# Patient Record
Sex: Female | Born: 1980 | Race: White | Hispanic: No | Marital: Single | Smoking: Never smoker
Health system: Southern US, Community
[De-identification: ages and names within clinical notes are randomized; demographics above are authoritative.]

## PROBLEM LIST (undated history)

## (undated) DIAGNOSIS — J45909 Unspecified asthma, uncomplicated: Secondary | ICD-10-CM

## (undated) DIAGNOSIS — T7840XA Allergy, unspecified, initial encounter: Secondary | ICD-10-CM

## (undated) DIAGNOSIS — F329 Major depressive disorder, single episode, unspecified: Secondary | ICD-10-CM

## (undated) DIAGNOSIS — F32A Depression, unspecified: Secondary | ICD-10-CM

## (undated) HISTORY — DX: Allergy, unspecified, initial encounter: T78.40XA

## (undated) HISTORY — DX: Major depressive disorder, single episode, unspecified: F32.9

## (undated) HISTORY — DX: Depression, unspecified: F32.A

---

## 2007-08-02 ENCOUNTER — Emergency Department (HOSPITAL_COMMUNITY): Admission: EM | Admit: 2007-08-02 | Discharge: 2007-08-02 | Payer: Self-pay | Admitting: Family Medicine

## 2007-12-12 ENCOUNTER — Emergency Department (HOSPITAL_COMMUNITY): Admission: EM | Admit: 2007-12-12 | Discharge: 2007-12-12 | Payer: Self-pay | Admitting: Family Medicine

## 2009-04-01 ENCOUNTER — Ambulatory Visit: Payer: Self-pay | Admitting: Occupational Medicine

## 2009-04-01 DIAGNOSIS — J309 Allergic rhinitis, unspecified: Secondary | ICD-10-CM | POA: Insufficient documentation

## 2009-04-01 DIAGNOSIS — J45909 Unspecified asthma, uncomplicated: Secondary | ICD-10-CM | POA: Insufficient documentation

## 2009-12-02 ENCOUNTER — Ambulatory Visit: Payer: Self-pay | Admitting: Family Medicine

## 2009-12-23 ENCOUNTER — Other Ambulatory Visit: Admission: RE | Admit: 2009-12-23 | Discharge: 2009-12-23 | Payer: Self-pay | Admitting: Family Medicine

## 2009-12-23 ENCOUNTER — Ambulatory Visit: Payer: Self-pay | Admitting: Family Medicine

## 2009-12-23 DIAGNOSIS — N912 Amenorrhea, unspecified: Secondary | ICD-10-CM | POA: Insufficient documentation

## 2009-12-25 LAB — CONVERTED CEMR LAB
AST: 14 units/L (ref 0–37)
Albumin: 4.8 g/dL (ref 3.5–5.2)
Alkaline Phosphatase: 72 units/L (ref 39–117)
Calcium: 9.2 mg/dL (ref 8.4–10.5)
Chloride: 106 meq/L (ref 96–112)
Glucose, Bld: 84 mg/dL (ref 70–99)
LDL Cholesterol: 103 mg/dL — ABNORMAL HIGH (ref 0–99)
LH: 14.7 milliintl units/mL
Potassium: 4.5 meq/L (ref 3.5–5.3)
Sodium: 141 meq/L (ref 135–145)
TSH: 1.964 microintl units/mL (ref 0.350–4.500)
Total Protein: 7 g/dL (ref 6.0–8.3)
Triglycerides: 69 mg/dL (ref <150)

## 2009-12-27 LAB — CONVERTED CEMR LAB: Pap Smear: NEGATIVE

## 2010-01-05 ENCOUNTER — Ambulatory Visit: Payer: Self-pay | Admitting: Emergency Medicine
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
## 2010-03-18 NOTE — Assessment & Plan Note (Signed)
Summary: COUGH,FEVER,CONGESTION/TJ   Vital Signs:  Patient Profile:   30 Years Old Female CC:      Congestion, Productive cough, eyes matted shut this am, x 4 days Height:     63 inches Weight:      240 pounds O2 Sat:      98 % O2 treatment:    Room Air Temp:     99.2 degrees F oral Pulse rate:   89 / minute Pulse rhythm:   regular Resp:     20 per minute BP sitting:   135 / 88  (right arm) Cuff size:   large  Pt. in pain?   yes  Vitals Entered By: Emilio Math, CMA                   Current Allergies: ! * SEAFOOD ! * POLLEN ! * DUST ! * MOLDHistory of Present Illness Chief Complaint: Congestion, Productive cough, eyes matted shut this am, x 4 days History of Present Illness: Presents with a 4 day history of sinus congestion and drainage.   Productive cough.   Says she has felt cold and hot.   Taking tylenol.   Complains of generalized body aches and generally feeling miserable.  History of asthma.   Some wheezing lately.  Used her nebulizer today.    Current Meds MUCINEX DM 30-600 MG XR12H-TAB (DEXTROMETHORPHAN-GUAIFENESIN)  TYLENOL 325 MG TABS (ACETAMINOPHEN) 3 at a time ALBUTEROL SULFATE (2.5 MG/3ML) 0.083% NEBU (ALBUTEROL SULFATE)  PREDNISONE 10 MG TABS (PREDNISONE) 2 PO BID for 2 days, then 1 BID for 2 days, then 1 daily for 2 days.  Take PC CHERATUSSIN DAC 30-10-100 MG/5ML SOLN (PSEUDOEPHEDRINE-CODEINE-GG) 5 ml every 8 hours for cough  REVIEW OF SYSTEMS Constitutional Symptoms       Complains of fever.     Denies chills, night sweats, weight loss, weight gain, and fatigue.  Eyes       Complains of eye pain and eye drainage.      Denies change in vision, glasses, contact lenses, and eye surgery. Ear/Nose/Throat/Mouth       Complains of ear pain, frequent runny nose, sinus problems, and sore throat.      Denies hearing loss/aids, change in hearing, ear discharge, dizziness, frequent nose bleeds, hoarseness, and tooth pain or bleeding.  Respiratory  Complains of productive cough, wheezing, shortness of breath, and asthma.      Denies dry cough, bronchitis, and emphysema/COPD.  Cardiovascular       Complains of chest pain.      Denies murmurs and tires easily with exhertion.    Gastrointestinal       Denies stomach pain, nausea/vomiting, diarrhea, constipation, blood in bowel movements, and indigestion. Genitourniary       Denies painful urination, kidney stones, and loss of urinary control. Neurological       Complains of headaches.      Denies paralysis, seizures, and fainting/blackouts. Musculoskeletal       Denies muscle pain, joint pain, joint stiffness, decreased range of motion, redness, swelling, muscle weakness, and gout.  Skin       Denies bruising, unusual mles/lumps or sores, and hair/skin or nail changes.  Psych       Denies mood changes, temper/anger issues, anxiety/stress, speech problems, depression, and sleep problems.  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  Take PC  #14 x 0, 04/01/2009, Kathrine Haddock MD  New Orders: New Patient Level III [id number] Planning Comments:   supportive care prednisone taper Hot steamy showers to help sinus drainage Cheratussin PRN   The patient and/or caregiver has been counseled thoroughly with regard to medications prescribed including dosage, schedule, interactions, rationale for use, and  possible side effects and they verbalize understanding.  Diagnoses and expected course of recovery discussed and will return if not improved as expected or if the condition worsens. Patient and/or caregiver verbalized understanding.  Prescriptions: CHERATUSSIN DAC 30-10-100 MG/5ML SOLN (PSEUDOEPHEDRINE-CODEINE-GG) 5 ml every 8 hours for cough  #4 oz x 0   Entered and Authorized by:   Kathrine Haddock MD   Signed by:   Kathrine Haddock MD on 04/01/2009   Method used:   Print then Give to Patient   RxID:   [id number] PREDNISONE 10 MG TABS (PREDNISONE) 2 PO BID for 2 days, then 1 BID for 2 days, then 1 daily for 2 days.  Take PC  #14 x 0   Entered and Authorized by:   Kathrine Haddock MD   Signed by:   Kathrine Haddock MD on 04/01/2009   Method used:   Print then Give to Patient   RxID:   [id number]

## 2010-03-18 NOTE — Assessment & Plan Note (Signed)
Summary: COLD/TM   Vital Signs:  Patient Profile:   30 Years Old Female CC:      Runny nose, productive cough, fever, chills, sore throat x  6 days Height:     63 inches Weight:      203 pounds O2 Sat:      97 % O2 treatment:    Room Air Temp:     99.0 degrees F oral Pulse rate:   69 / minute Pulse rhythm:   regular Resp:     14 per minute BP sitting:   120 / 84  (left arm) Cuff size:   regular  Vitals Entered By: Emilio Math (January 05, 2010 12:42 PM)                  Current Allergies (reviewed today): ! * SEAFOOD ! * POLLEN ! * DUST ! * MOLDHistory of Present Illness Chief Complaint: Runny nose, productive cough, fever, chills, sore throat x  6 days History of Present Illness: Patient complains of onset of cold symptoms for 6 days.  They have been using a lot of different OTC cough & cold meds which is helping a little bit.  She has a history of both allergies and asthma. + sore throat + cough No pleuritic pain + wheezing No nasal congestion + post-nasal drainage No sinus pain/pressure No itchy/red eyes No earache No hemoptysis No SOB No chills/sweats No fever No nausea No vomiting No abdominal pain No diarrhea No skin rashes + fatigue No myalgias + headache   Current Meds PROVERA 10 MG TABS (MEDROXYPROGESTERONE ACETATE) 1 tab by mouth at bedtime x 10 days TYLENOL COLD MULTI-SYMPTOM 10-5-325 MG/15ML LIQD (DM-PHENYLEPHRINE-ACETAMINOPHEN)  MUCINEX DM 30-600 MG XR12H-TAB (DEXTROMETHORPHAN-GUAIFENESIN)  CLARITIN 10 MG TABS (LORATADINE)  PREDNISONE 10 MG TABS (PREDNISONE) 20mg  two times a day for 5 days AMOXICILLIN 875 MG TABS (AMOXICILLIN) 1 tab by mouth two times a day for 7 days  REVIEW OF SYSTEMS Constitutional Symptoms       Complains of fever and chills.     Denies night sweats, weight loss, weight gain, and fatigue.  Eyes       Denies change in vision, eye pain, eye discharge, glasses, contact lenses, and eye surgery. Ear/Nose/Throat/Mouth       Complains of ear pain, frequent runny nose, sinus problems, sore throat, and hoarseness.      Denies hearing loss/aids, change in hearing, ear discharge, dizziness, frequent nose bleeds, and tooth pain or bleeding.  Respiratory       Complains of productive cough, wheezing, shortness of breath, and asthma.      Denies dry cough, bronchitis, and emphysema/COPD.  Cardiovascular       Complains of chest pain.      Denies murmurs and tires easily with exhertion.    Gastrointestinal       Complains of stomach pain, nausea/vomiting, and diarrhea.      Denies constipation, blood in bowel movements, and indigestion. Genitourniary       Denies painful urination, kidney stones, and loss of urinary control. Neurological       Complains of headaches.      Denies paralysis, seizures, and fainting/blackouts. Musculoskeletal       Denies muscle pain, joint pain, joint stiffness, decreased range of motion, redness, swelling, muscle weakness, and gout.  Skin       Denies bruising, unusual mles/lumps or sores, and hair/skin or nail changes.  Psych       Denies mood changes,  temper/anger issues, anxiety/stress, speech problems, depression, and sleep problems.  Past History:  Past Medical History: Reviewed history from 12/02/2009 and no changes required. Allergic rhinitis Asthma teenage depression  Past Surgical History: Reviewed history from 04/01/2009 and no changes required. Denies surgical history  Family History: Reviewed history from 12/02/2009 and no changes required. Mother, (CML) Leukemia, HTN,Diabetes,High Cholesterol, DM Father, UKN, ETOH sister asthma, HTN fam hx of depression.  Social History: Reviewed history from 12/02/2009 and no changes required. 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 Rare ETOH. Exercises 3-4 x a wk. Wt down from 260 in April. In a lesbian relationship. Physical Exam General appearance: well developed, well nourished, hoarse  voice Pupils: equal, round, reactive to light Ears: mild clear fluid behind L TM, o/w normal Nasal: clear discharge Oral/Pharynx: clear post nasal drip, tonsillar enlargement and mild erythema bilateral Neck: neck supple,  trachea midline, no masses Chest/Lungs: no rales, wheezes, or rhonchi bilateral, breath sounds equal without effort Heart: regular rate and  rhythm, no murmur Skin: no obvious rashes or lesions MSE: oriented to time, place, and person Assessment New Problems: ASTHMA (ICD-493.90) UPPER RESPIRATORY INFECTION, ACUTE (ICD-465.9)   Patient Education: Patient and/or caregiver instructed in the following: rest, fluids.  Plan New Medications/Changes: AMOXICILLIN 875 MG TABS (AMOXICILLIN) 1 tab by mouth two times a day for 7 days  #14 x 0, 01/05/2010, Hoyt Koch MD PREDNISONE 10 MG TABS (PREDNISONE) 20mg  two times a day for 5 days  #QS x 0, 01/05/2010, Hoyt Koch MD  New Orders: Est. Patient Level IV [09811] Pulse Oximetry (single measurment) [94760] Nebulizer Tx [94640] Albuterol Sulfate Sol 1mg  unit dose [B1478] Ipratropium inhalation sol. unit dose [G9562] Planning Comments:   1)  Take the prescribed antibiotic as instructed. (hold for a few days.  If after using prednisone for a few days + inhaler + OTC cold meds, you aren't getting better, you may take the antibiotic) 2)  Use nasal saline solution (over the counter) at least 3 times a day. 3)  Use over the counter decongestants like Zyrtec-D every 12 hours as needed to help with congestion. 4)  Can take tylenol every 6 hours or motrin every 8 hours for pain or fever. 5)  Follow up with your primary doctor  if no improvement in 5-7 days, sooner if increasing pain, fever, or new symptoms.     The patient and/or caregiver has been counseled thoroughly with regard to medications prescribed including dosage, schedule, interactions, rationale for use, and possible side effects and they verbalize  understanding.  Diagnoses and expected course of recovery discussed and will return if not improved as expected or if the condition worsens. Patient and/or caregiver verbalized understanding.  Prescriptions: AMOXICILLIN 875 MG TABS (AMOXICILLIN) 1 tab by mouth two times a day for 7 days  #14 x 0   Entered and Authorized by:   Hoyt Koch MD   Signed by:   Hoyt Koch MD on 01/05/2010   Method used:   Print then Give to Patient   RxID:   [id number] PREDNISONE 10 MG TABS (PREDNISONE) 20mg  two times a day for 5 days  #QS x 0   Entered and Authorized by:   Hoyt Koch MD   Signed by:   Hoyt Koch MD on 01/05/2010   Method used:   Print then Give to Patient   RxID:   [id number]   Medication Administration  Medication # 1:    Medication: Albuterol Sulfate Sol 1mg  unit dose  Diagnosis: ASTHMA (ICD-493.90)    Dose: 3.0 mgs    Route: inhaled    Exp Date: 04/17/2010    Lot #: [id number]    Mfr: Mylan    Given by: Emilio Math (January 05, 2010 1:02 PM)  Medication # 2:    Medication: Ipratropium inhalation sol. unit dose    Diagnosis: ASTHMA (ICD-493.90)    Dose: 0.5 mgs    Route: inhaled    Exp Date: 04/17/2010    Lot #: [id number]    Mfr: Mylan    Patient tolerated medication without complications    Given by: Emilio Math (January 05, 2010 1:03 PM)  Orders Added: 1)  Est. Patient Level IV [04540] 2)  Pulse Oximetry (single measurment) [94760] 3)  Nebulizer Tx [94640] 4)  Albuterol Sulfate Sol 1mg  unit dose [J7613] 5)  Ipratropium inhalation sol. unit dose [J8119]     Medication Administration  Medication # 1:    Medication: Albuterol Sulfate Sol 1mg  unit dose    Diagnosis: ASTHMA (ICD-493.90)    Dose: 3.0 mgs    Route: inhaled    Exp Date: 04/17/2010    Lot #: [id number]    Mfr: Mylan    Given by: Emilio Math (January 05, 2010 1:02 PM)  Medication # 2:    Medication: Ipratropium inhalation sol. unit dose    Diagnosis: ASTHMA  (ICD-493.90)    Dose: 0.5 mgs    Route: inhaled    Exp Date: 04/17/2010    Lot #: [id number]    Mfr: Mylan    Patient tolerated medication without complications    Given by: Emilio Math (January 05, 2010 1:03 PM)  Orders Added: 1)  Est. Patient Level IV [14782] 2)  Pulse Oximetry (single measurment) [94760] 3)  Nebulizer Tx [94640] 4)  Albuterol Sulfate Sol 1mg  unit dose [J7613] 5)  Ipratropium inhalation sol. unit dose [N5621]

## 2010-03-18 NOTE — Miscellaneous (Signed)
Summary: Record/[employer]  Record/[employer]   Imported By: Sherian Rein 12/17/2009 11:07:53  _____________________________________________________________________  External Attachment:    Type:   Image     Comment:   External Document

## 2010-03-18 NOTE — Assessment & Plan Note (Signed)
Summary: CPE with pap   Vital Signs:  Patient profile:   30 year old female Menstrual status:  irregular LMP:     11/2008 Height:      63 inches Weight:      208 pounds BMI:     36.98 O2 Sat:      98 % on Room air Pulse rate:   63 / minute BP sitting:   123 / 79  (left arm) Cuff size:   regular  Vitals Entered By: Payton Spark CMA (December 23, 2009 9:19 AM)  O2 Flow:  Room air CC: CPE w/ pap LMP (date): 11/2008     Menstrual Status irregular Enter LMP: 11/2008   Primary Care Provider:  Seymour Bars DO  CC:  CPE w/ pap.  History of Present Illness: 30 yo nulligravid WF presents for CPE with pap smear.    She is in a same sex relationship and has had a full year w/o a period.  She has slowly been losing wt over the past 6 months, down from 260 max with changes to diet and exercise.  She is not on any contraceptives.  Denies discharge.  Due for fasting labs.  Tdap done 07.  Flu shot is due.  Considering future fertility once she gets her weight down more.  Denies fam hx of breast or colon cancer or of premature heart dz.  Current Medications (verified): 1)  None  Allergies (verified): 1)  ! * Seafood 2)  ! * Pollen 3)  ! * Dust 4)  ! * Mold  Past History:  Past Medical History: Reviewed history from 12/02/2009 and no changes required. Allergic rhinitis Asthma teenage depression  Past Surgical History: Reviewed history from 04/01/2009 and no changes required. Denies surgical history  Family History: Reviewed history from 12/02/2009 and no changes required. Mother, (CML) Leukemia, HTN,Diabetes,High Cholesterol, DM Father, UKN, ETOH sister asthma, HTN fam hx of depression.  Social History: Reviewed history from 12/02/2009 and no changes required. 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 Rare ETOH. Exercises 3-4 x a wk. Wt down from 260 in April. In a lesbian relationship.  Review of Systems  The patient denies anorexia, fever,  weight loss, weight gain, vision loss, decreased hearing, hoarseness, chest pain, syncope, dyspnea on exertion, peripheral edema, prolonged cough, headaches, hemoptysis, abdominal pain, melena, hematochezia, severe indigestion/heartburn, hematuria, incontinence, genital sores, muscle weakness, suspicious skin lesions, transient blindness, difficulty walking, depression, unusual weight change, abnormal bleeding, enlarged lymph nodes, angioedema, breast masses, and testicular masses.    Physical Exam  General:  alert, well-developed, well-nourished, and well-hydrated.   Head:  normocephalic and atraumatic.   Eyes:  wears glasses Ears:  scarring over both TMs Nose:  no nasal discharge.   Mouth:  good dentition and pharynx pink and moist.   Neck:  no masses.   Breasts:  No mass, nodules, thickening, tenderness, bulging, retraction, inflamation, nipple discharge or skin changes noted.   Lungs:  Normal respiratory effort, chest expands symmetrically. Lungs are clear to auscultation, no crackles or wheezes. Heart:  Normal rate and regular rhythm. S1 and S2 normal without gallop, murmur, click, rub or other extra sounds. Abdomen:  Bowel sounds positive,abdomen soft and non-tender without masses, organomegaly Genitalia:  Pelvic Exam:        External: normal female genitalia without lesions or masses        Vagina: normal without lesions or masses        Cervix: normal without lesions or  masses; ectropion present        Adnexa: normal bimanual exam without masses or fullness        Uterus: normal by palpation        Pap smear: performed Pulses:  2+ radial and pedal pulses Extremities:  pes planus, no LE edema Skin:  color normal, no rashes, and no suspicious lesions.   Cervical Nodes:  No lymphadenopathy noted Psych:  good eye contact, not anxious appearing, and not depressed appearing.     Impression & Recommendations:  Problem # 1:  ROUTINE GYNECOLOGICAL EXAMINATION (ICD-V72.31) Thin prep pap  done. Keeping healthy checklist for women reviewed. BP at goal. BMI 36 = class II obesity.  Working on wt loss thru diet/ exercise, wt down from 260. Update fasting labs. MVI daily. Declined contraception (in lesbian relationship). Progesterone withdrawl to restart cycle.    Complete Medication List: 1)  Provera 10 Mg Tabs (Medroxyprogesterone acetate) .Marland Kitchen.. 1 tab by mouth at bedtime x 10 days  Other Orders: T-LH ([id number]) T-FSH [id number]) T-Comprehensive Metabolic Panel [id number]) T-Lipid Profile [id number]) T-TSH [id number])  Patient Instructions: 1)  Labs downstairs today. 2)  Will call you w/ results tomorrow. 3)  Trial of Progesterone withdrawl bleed -- take 1 tab at night x 10 days then stop.  Expect period to start within a week after stopping Progesterone. 4)  Call if any problems. 5)  REturn for f/u as needed. Prescriptions: PROVERA 10 MG TABS (MEDROXYPROGESTERONE ACETATE) 1 tab by mouth at bedtime x 10 days  #10 tabs x 0   Entered and Authorized by:   Seymour Bars DO   Signed by:   Seymour Bars DO on 12/23/2009   Method used:   Electronically to        [facility] [phone]* (retail)       [address] [place], [place]  [address]       Ph: [phone]       Fax: [phone]   RxID:   [id number]    Orders Added: 1)  T-LH [[id number]] 2)  T-FSH [[id number]] 3)  T-Comprehensive Metabolic Panel [[id number]] 4)  T-Lipid Profile [[id number]] 5)  T-TSH [[id number]] 6)  Est. Patient age 28-28 [phone]

## 2010-06-02 ENCOUNTER — Encounter: Payer: Self-pay | Admitting: Family Medicine

## 2010-06-02 ENCOUNTER — Inpatient Hospital Stay (INDEPENDENT_AMBULATORY_CARE_PROVIDER_SITE_OTHER)
Admission: RE | Admit: 2010-06-02 | Discharge: 2010-06-02 | Disposition: A | Discharge status: Home / Self Care | Payer: BC Managed Care – HMO | Source: Ambulatory Visit | Attending: Family Medicine | Admitting: Family Medicine

## 2010-06-02 DIAGNOSIS — J309 Allergic rhinitis, unspecified: Secondary | ICD-10-CM

## 2010-06-02 DIAGNOSIS — M26629 Arthralgia of temporomandibular joint, unspecified side: Secondary | ICD-10-CM

## 2010-06-02 DIAGNOSIS — H6980 Other specified disorders of Eustachian tube, unspecified ear: Secondary | ICD-10-CM | POA: Insufficient documentation

## 2010-06-02 DIAGNOSIS — H9209 Otalgia, unspecified ear: Secondary | ICD-10-CM

## 2010-06-04 ENCOUNTER — Encounter: Payer: Self-pay | Admitting: Family Medicine

## 2010-06-04 ENCOUNTER — Inpatient Hospital Stay (INDEPENDENT_AMBULATORY_CARE_PROVIDER_SITE_OTHER)
Admission: RE | Admit: 2010-06-04 | Discharge: 2010-06-04 | Disposition: A | Discharge status: Home / Self Care | Payer: BC Managed Care – HMO | Source: Ambulatory Visit | Attending: Family Medicine | Admitting: Family Medicine

## 2010-06-04 ENCOUNTER — Telehealth (INDEPENDENT_AMBULATORY_CARE_PROVIDER_SITE_OTHER): Payer: Self-pay | Admitting: *Deleted

## 2010-06-04 DIAGNOSIS — H6980 Other specified disorders of Eustachian tube, unspecified ear: Secondary | ICD-10-CM

## 2010-06-04 DIAGNOSIS — H9209 Otalgia, unspecified ear: Secondary | ICD-10-CM

## 2010-06-04 DIAGNOSIS — M26629 Arthralgia of temporomandibular joint, unspecified side: Secondary | ICD-10-CM

## 2010-06-04 DIAGNOSIS — H8110 Benign paroxysmal vertigo, unspecified ear: Secondary | ICD-10-CM

## 2010-06-06 ENCOUNTER — Telehealth (INDEPENDENT_AMBULATORY_CARE_PROVIDER_SITE_OTHER): Payer: Self-pay | Admitting: *Deleted

## 2010-06-09 ENCOUNTER — Ambulatory Visit (INDEPENDENT_AMBULATORY_CARE_PROVIDER_SITE_OTHER): Payer: BC Managed Care – HMO | Admitting: Family Medicine

## 2010-06-09 ENCOUNTER — Encounter: Payer: Self-pay | Admitting: Family Medicine

## 2010-06-09 VITALS — BP 118/82 | HR 68 | Temp 98.2°F | Ht 63.0 in | Wt 193.0 lb

## 2010-06-09 DIAGNOSIS — H9209 Otalgia, unspecified ear: Secondary | ICD-10-CM | POA: Insufficient documentation

## 2010-06-09 DIAGNOSIS — G43909 Migraine, unspecified, not intractable, without status migrainosus: Secondary | ICD-10-CM | POA: Insufficient documentation

## 2010-06-09 DIAGNOSIS — R42 Dizziness and giddiness: Secondary | ICD-10-CM

## 2010-06-09 NOTE — Assessment & Plan Note (Signed)
Vertigo seems to have improved but she has lightheadedness which could be eustachian tube dysfunction as listed in Dr Rolla Plate notes.  She can certainly use an OTC anti histamine daily but we need to r/o iron def anemia today with labs.  BP is normal.  Ear exam is normal.  This could also be secondary to chronic daily HAs.

## 2010-06-09 NOTE — Assessment & Plan Note (Signed)
We had a long talk about her of daily HAs and her fam hx of HAs.  She qualifies for a dx of migraines, intractable with associated neck pain and lightheadeness.  Will get labs first and h/o given to pt on migraines and prevention of migraines today.  May add topamax tomorrow.

## 2010-06-09 NOTE — Patient Instructions (Signed)
Labs downstairs today.  Read thru handout.  Will call you w/ results tomorrow and will discuss the proper treatment plan.

## 2010-06-09 NOTE — Progress Notes (Signed)
  Subjective:    Patient ID: Maria Mack, female    DOB: 1981/01/26, 30 y.o.   MRN: [id number]  HPI 30 yo WF presents for almost 2 wks of R ear pain and lightheadedness.  She went to UC and was treated with Zithromax and Prednisone.  Her pain has improved but she still feels lightheaded.  She has also been getting more frequent HAs.  She has some true vertigo.  No nausea or vomitting.  Has tinnitus and feels like her hearing has changed.  She has chronic daily Ha, hx of TMJ and associated neck pain.  Only using Excedrin prn.  She is set up to see [facility] in 2 days.  BP 118/82  Pulse 68  Temp(Src) 98.2 F (36.8 C) (Oral)  Ht 5\' 3"  (1.6 m)  Wt 193 lb (87.544 kg)  BMI 34.19 kg/m2  SpO2 99%    Review of Systems  Constitutional: Negative for fever.  HENT: Positive for hearing loss, ear pain, neck pain and tinnitus. Negative for congestion, rhinorrhea and ear discharge.   Eyes: Negative for visual disturbance.  Respiratory: Negative for cough and shortness of breath.   Cardiovascular: Negative for chest pain.  Neurological: Positive for light-headedness and headaches. Negative for numbness.  Psychiatric/Behavioral: The patient is not nervous/anxious.        Objective:   Physical Exam  Constitutional: She appears well-developed and well-nourished. No distress.  HENT:  Head: Normocephalic and atraumatic.  Right Ear: External ear normal.  Left Ear: External ear normal.  Nose: Nose normal.  Mouth/Throat: Oropharynx is clear and moist.       Boggy turbinates  Eyes: Conjunctivae are normal.  Neck: Neck supple. No thyromegaly present.       Tight trapezious muscles  Cardiovascular: Normal rate, regular rhythm and normal heart sounds.   No murmur heard. Pulmonary/Chest: Effort normal and breath sounds normal.  Musculoskeletal: She exhibits no edema.  Lymphadenopathy:    She has no cervical adenopathy.  Skin: Skin is warm and dry.  Psychiatric: She has a normal mood and affect.           Assessment & Plan:

## 2010-06-09 NOTE — Assessment & Plan Note (Signed)
Improving after prednisone and zithromax.  Ear exam normal.  She has appt with ENT.  Likely to also have TMJ.

## 2010-06-10 ENCOUNTER — Telehealth: Payer: Self-pay | Admitting: Family Medicine

## 2010-06-10 LAB — CBC WITH DIFFERENTIAL/PLATELET
Basophils Absolute: 0 10*3/uL (ref 0.0–0.1)
Basophils Relative: 0 % (ref 0–1)
Eosinophils Absolute: 0.1 10*3/uL (ref 0.0–0.7)
MCH: 30.5 pg (ref 26.0–34.0)
MCHC: 33.9 g/dL (ref 30.0–36.0)
Neutrophils Relative %: 64 % (ref 43–77)
Platelets: 368 10*3/uL (ref 150–400)

## 2010-06-10 LAB — BASIC METABOLIC PANEL WITH GFR
Calcium: 10 mg/dL (ref 8.4–10.5)
Creat: 0.84 mg/dL (ref 0.40–1.20)
GFR, Est African American: >60 mL/min (ref >60)
GFR, Est Non African American: >60 mL/min (ref >60)

## 2010-06-10 LAB — FERRITIN: Ferritin: 112 ng/mL (ref 10–291)

## 2010-06-10 NOTE — Telephone Encounter (Signed)
Pt aware of the above  

## 2010-06-10 NOTE — Telephone Encounter (Signed)
Pls let pt know that her WBC is only slightly elevated.  No sign of iron deficiency.  Electrolytes and kidney function are normal.  Pls fax copy to [facility] for tomorrow's appt.

## 2010-06-14 ENCOUNTER — Encounter: Payer: Self-pay | Admitting: Family Medicine

## 2010-06-16 ENCOUNTER — Ambulatory Visit (INDEPENDENT_AMBULATORY_CARE_PROVIDER_SITE_OTHER): Payer: BC Managed Care – HMO | Admitting: Family Medicine

## 2010-06-16 ENCOUNTER — Encounter: Payer: Self-pay | Admitting: Family Medicine

## 2010-06-16 DIAGNOSIS — G43909 Migraine, unspecified, not intractable, without status migrainosus: Secondary | ICD-10-CM

## 2010-06-16 DIAGNOSIS — M26629 Arthralgia of temporomandibular joint, unspecified side: Secondary | ICD-10-CM | POA: Insufficient documentation

## 2010-06-16 DIAGNOSIS — M2669 Other specified disorders of temporomandibular joint: Secondary | ICD-10-CM

## 2010-06-16 MED ORDER — NORTRIPTYLINE HCL 10 MG PO CAPS: ORAL_CAPSULE | ORAL | Status: DC

## 2010-06-16 NOTE — Assessment & Plan Note (Addendum)
H/O given last visit on migraines and prevention.  Long hx of chronic daily HA with fam hx of migraines.  She meets IHS criteria for migraines.  HAs are causing neck pain.  Will start her on Pamelor 10 mg qhs x 2 wks then will try to increase her to 20 mg/ day.  Call if any problems.  Avoid rescue NSAIDs more than 2 days/ wk.  Adding chiropractic care for associated neck pain will also help.  Number given for Dr Arville Care. RTC for f/u in 2 mos.

## 2010-06-16 NOTE — Patient Instructions (Signed)
Start Pamelor 10 mg at bedtime for the first 2 wks.  If you are not too sleepy during the day, go up to 20 mg at night.  This should help space out your # of Headaches each month.  Call Dr Arville Care to set up chiropractic appt for neck pain/ migraines.  REturn for f/u migraines/ neck pain in 2 mos.

## 2010-06-16 NOTE — Progress Notes (Signed)
  Subjective:    Patient ID: Maria Mack, female    DOB: 20-Jun-1980, 30 y.o.   MRN: [id number]  HPI  30 yo WF presents for f/u visit.  I saw her on 4-23.  She did see [facility] for her episode of vertigo and ear pain.  She was told that her 'ears were fine' and that her TMJ and HAs were likely the cause for her current symptoms.  She continues to have chronic daily HA w/o vision changes, N/V.  She has TMJ and tries to avoid chewy foods and gum.  Wears a bite guard at night which helps.  She reviewed the handout from last visit on migraine prevention.  She denies any more vertigo or any presyncope.  No longer having ear pain but has neck pain and jaw pain.   BP 119/77  Pulse 73  Wt 196 lb (88.905 kg)  SpO2 99%   Review of Systems  Constitutional: Negative for fever and fatigue.  HENT: Positive for rhinorrhea and neck pain. Negative for hearing loss, ear pain and congestion.   Eyes: Negative for photophobia, pain and visual disturbance.  Respiratory: Negative for cough and shortness of breath.   Cardiovascular: Negative for chest pain, palpitations and leg swelling.  Neurological: Positive for headaches. Negative for dizziness, tremors, weakness, light-headedness and numbness.  Psychiatric/Behavioral: Negative for dysphoric mood. The patient is not nervous/anxious.        Objective:   Physical Exam  Constitutional: She appears well-developed and well-nourished. No distress.       obese  HENT:  Head: Normocephalic and atraumatic.       Popping over both TMJs  Eyes: Conjunctivae and EOM are normal. Pupils are equal, round, and reactive to light.  Neck: Normal range of motion. Neck supple.  Cardiovascular: Normal rate, regular rhythm and normal heart sounds.   No murmur heard. Pulmonary/Chest: Effort normal and breath sounds normal. No respiratory distress. She has no wheezes.  Musculoskeletal: Normal range of motion. She exhibits no edema.  Lymphadenopathy:    She has no cervical  adenopathy.  Neurological: Coordination normal.  Skin: Skin is warm and dry.  Psychiatric: She has a normal mood and affect.          Assessment & Plan:

## 2010-06-16 NOTE — Assessment & Plan Note (Signed)
TMJ.  Improving with avoidance of chewy foods and gum.  Improved with wearing night guard.  Pamelor may also help w/ this pain syndrome.

## 2010-07-23 ENCOUNTER — Inpatient Hospital Stay (INDEPENDENT_AMBULATORY_CARE_PROVIDER_SITE_OTHER)
Admission: RE | Admit: 2010-07-23 | Discharge: 2010-07-23 | Disposition: A | Discharge status: Home / Self Care | Payer: BC Managed Care – HMO | Source: Ambulatory Visit | Attending: Emergency Medicine | Admitting: Emergency Medicine

## 2010-07-23 ENCOUNTER — Encounter: Payer: Self-pay | Admitting: Emergency Medicine

## 2010-07-23 ENCOUNTER — Encounter (INDEPENDENT_AMBULATORY_CARE_PROVIDER_SITE_OTHER): Payer: Self-pay | Admitting: *Deleted

## 2010-07-23 DIAGNOSIS — K5289 Other specified noninfective gastroenteritis and colitis: Secondary | ICD-10-CM | POA: Insufficient documentation

## 2010-07-30 ENCOUNTER — Encounter: Payer: Self-pay | Admitting: Family Medicine

## 2010-07-30 ENCOUNTER — Ambulatory Visit (INDEPENDENT_AMBULATORY_CARE_PROVIDER_SITE_OTHER): Payer: BC Managed Care – HMO | Admitting: Family Medicine

## 2010-07-30 VITALS — BP 132/83 | HR 91 | Ht 63.0 in | Wt 192.0 lb

## 2010-07-30 DIAGNOSIS — G43909 Migraine, unspecified, not intractable, without status migrainosus: Secondary | ICD-10-CM

## 2010-07-30 DIAGNOSIS — F329 Major depressive disorder, single episode, unspecified: Secondary | ICD-10-CM | POA: Insufficient documentation

## 2010-07-30 DIAGNOSIS — F3289 Other specified depressive episodes: Secondary | ICD-10-CM | POA: Insufficient documentation

## 2010-07-30 MED ORDER — SERTRALINE HCL 50 MG PO TABS: 50.0000 mg | ORAL_TABLET | Freq: Every day | ORAL | Status: DC

## 2010-07-30 MED ORDER — LORAZEPAM 1 MG PO TABS: ORAL_TABLET | ORAL | Status: AC

## 2010-07-30 NOTE — Patient Instructions (Signed)
Stop Pamelor.  Start Zoloft 50 mg once daily for mood. Use Ativan as needed for anxiety/ panic attacks. Referral made to psychology downstairs for counseling. Work on stress reduction.  Call me if any problems including worsening mood.  Return for f/u visit in 3 wks.

## 2010-07-30 NOTE — Assessment & Plan Note (Signed)
Recent worsening in depression, anxiety, IBS and panic attacks related to work stressors. Will change her Pamelor to Zoloft, adding Ativan prn for anxiety.  Counseling referral made.  FMLA papers completed to keep her out for 4 wks during time of introduction to new treatment.  She has a good support system.  Will call if any worsening mood.

## 2010-07-30 NOTE — Assessment & Plan Note (Signed)
Stress induced.  Will deal with her underlying mood issues and acute stressors, then may add back prophylactic medications.

## 2010-07-30 NOTE — Progress Notes (Signed)
  Subjective:    Patient ID: Maria Mack, female    DOB: 11/02/1980, 30 y.o.   MRN: [id number]  HPI  30 yo WF presents for depression and anxiety.  She had been having chronic daily migraines for which we started her on Pamelor for last visit .  She has recently realized that her job is the source of her stress and she has been so stressed out at work that she has had migraines with neck pain and nausea with vomitting.  She is getting frequent panic attacks.  The past few days, she has felt increasingly depressed which she battled in her past.  Her partner is supportive.    BP 132/83  Pulse 91  Ht 5\' 3"  (1.6 m)  Wt 192 lb (87.091 kg)  BMI 34.01 kg/m2  SpO2 100%    Review of Systems  Constitutional: Negative for fatigue.  Eyes: Negative for photophobia.  Gastrointestinal: Positive for nausea and vomiting.  Neurological: Positive for headaches.  Psychiatric/Behavioral: Positive for sleep disturbance, dysphoric mood and decreased concentration. Negative for suicidal ideas and self-injury. The patient is nervous/anxious.        Objective:   Physical Exam  Constitutional: She appears well-developed and well-nourished. No distress.       obese  HENT:  Mouth/Throat: Oropharynx is clear and moist.  Eyes: Pupils are equal, round, and reactive to light.  Cardiovascular: Normal rate, regular rhythm and normal heart sounds.   Pulmonary/Chest: Effort normal and breath sounds normal.  Skin: Skin is warm and dry.  Psychiatric: Her mood appears anxious. She exhibits a depressed mood.          Assessment & Plan:

## 2010-08-15 ENCOUNTER — Ambulatory Visit (HOSPITAL_COMMUNITY): Payer: BC Managed Care – HMO | Admitting: Psychology

## 2010-08-18 ENCOUNTER — Ambulatory Visit: Payer: BC Managed Care – HMO | Admitting: Family Medicine

## 2010-11-13 LAB — POCT URINALYSIS DIP (DEVICE)
Glucose, UA: NEGATIVE
Ketones, ur: NEGATIVE
Nitrite: NEGATIVE
Operator id: 12649

## 2011-01-19 NOTE — Progress Notes (Signed)
Summary: POSSIBLE EAR INFECTION IN LEFT EAR/HEADACHE/NOSEBLEEDS rm 4   Vital Signs:  Patient Profile:   30 Years Old Female CC:      HA and bilateral ear ache x 2wks Height:     63 inches Weight:      193.50 pounds O2 Sat:      98 % O2 treatment:    Room Air Temp:     98.5 degrees F oral Pulse rate:   96 / minute Resp:     18 per minute BP sitting:   131 / 77  (left arm) Cuff size:   regular  Vitals Entered By: Clemens Catholic LPN (June 02, 2010 10:17 AM)                  Updated Prior Medication List: PROVENTIL HFA 108 (90 BASE) MCG/ACT AERS (ALBUTEROL SULFATE)   Current Allergies (reviewed today): ! * SEAFOOD ! * POLLEN ! * DUST ! * MOLDHistory of Present Illness Chief Complaint: HA and bilateral ear ache x 2wks History of Present Illness:  Subjective:  Patient complains of bilateral earache for one week, worse on the left.  The pain is sharp and intermittent.  Excedrin has not been very helpful.  She states that she has a long history of headaches and anxiety, which have been worse as a result of the earache.  She has also had a mild sore throat for about 2 weeks.  No fevers, chills, and sweats.  She has a history of seasonal allergies, and has been taking Claritin.  Minimal cough.  She states that her jaws pop when she opens her mouth widely.  REVIEW OF SYSTEMS Constitutional Symptoms      Denies fever, chills, night sweats, weight loss, weight gain, and fatigue.  Eyes       Complains of eye pain.      Denies change in vision, eye discharge, glasses, contact lenses, and eye surgery. Ear/Nose/Throat/Mouth       Complains of ear pain and sore throat.      Denies hearing loss/aids, change in hearing, ear discharge, dizziness, frequent runny nose, frequent nose bleeds, sinus problems, hoarseness, and tooth pain or bleeding.  Respiratory       Denies dry cough, productive cough, wheezing, shortness of breath, asthma, bronchitis, and emphysema/COPD.  Cardiovascular   Denies murmurs, chest pain, and tires easily with exhertion.    Gastrointestinal       Denies stomach pain, nausea/vomiting, diarrhea, constipation, blood in bowel movements, and indigestion. Genitourniary       Denies painful urination, kidney stones, and loss of urinary control. Neurological       Complains of headaches.      Denies paralysis, seizures, and fainting/blackouts. Musculoskeletal       Denies muscle pain, joint pain, joint stiffness, decreased range of motion, redness, swelling, muscle weakness, and gout.  Skin       Denies bruising, unusual mles/lumps or sores, and hair/skin or nail changes.  Psych       Complains of anxiety/stress.      Denies mood changes, temper/anger issues, speech problems, depression, and sleep problems. Other Comments: pt c/o HA, pain behind LT eye, bilateral ear ache (LT ear is worse) x 2wks.  she has taken Excedrin with no relief.   Past History:  Past Medical History: Reviewed history from 12/02/2009 and no changes required. Allergic rhinitis Asthma teenage depression  Past Surgical History: Reviewed history from 04/01/2009 and no changes required. Denies surgical history  Family History: Reviewed history from 12/02/2009 and no changes required. Mother, (CML) Leukemia, HTN,Diabetes,High Cholesterol, DM Father, UKN, ETOH sister asthma, HTN fam hx of depression.  Social History: Reviewed history from 12/02/2009 and no changes required. 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 Rare ETOH. Exercises 3-4 x a wk. Wt down from 260 in April. In a lesbian relationship.   Objective:  Appearance:  Patient appears healthy, stated age, and in no acute distress  Eyes:  Pupils are equal, round, and reactive to light and accomdation.  Extraocular movement is intact.  Conjunctivae are not inflamed.  Ears:  Canals normal.  Tympanic membranes normal.  Left temporomandibular joint is distinctly tender to palpation  Nose:  Mildly  congested bilaterally; no sinus tenderness Pharynx:  Normal  Neck:  Supple.  Slightly tender shotty posterior nodes are palpated bilaterally.  Lungs:  Clear to auscultation.  Breath sounds are equal.  Heart:  Regular rate and rhythm without murmurs, rubs, or gallops.  Abdomen:  Nontender without masses or hepatosplenomegaly.  Bowel sounds are present.  No CVA or flank tenderness.  Skin:  No rash Tympanogram:  Bilateral positive peak pressure Assessment  Assessed ALLERGIC RHINITIS as deteriorated - Donna Christen MD New Problems: EAR PAIN, BILATERAL (ICD-388.70) TMJ PAIN (ICD-524.62) DYSFUNCTION OF EUSTACHIAN TUBE (ICD-381.81)  SUSPECT EAR SYMPTOMS ARE A COMBINATION OF ET DYSFUNCTION AND TMJ INFLAMMATION  Plan New Medications/Changes: AZITHROMYCIN 250 MG TABS (AZITHROMYCIN) Two tabs by mouth on day 1, then 1 tab daily on days 2 through 5 (Rx void after 06/10/10)  #6 tabs x 0, 06/02/2010, Donna Christen MD NASONEX 50 MCG/ACT SUSP (MOMETASONE FUROATE) Two sprays in each nostril once a day  #17gm x 1, 06/02/2010, Donna Christen MD PREDNISONE 10 MG TABS (PREDNISONE) 2 PO BID for 2 days, then 1 BID for 2 days, then 1 daily for 2 days.  Take PC  #14 x 0, 06/02/2010, Donna Christen MD  New Orders: Tympanometry [id number] Est. Patient Level IV [65784] Planning Comments:   Begin tapering course of prednisone.  Begin Mucinex D.  Recommend Afrin spray for about 5 days.  Begin saline nasal rinse.  Begin Nasonex nasal spray.  Begin Z-pack if not improving about 5 days (given Rx to hold) Apply ice pack to TMJ several times daily. Given a Water quality scientist patient information and instruction sheet on topic TMJ. Follow-up with ENT if not improving 7 to 10 days.   The patient and/or caregiver has been counseled thoroughly with regard to medications prescribed including dosage, schedule, interactions, rationale for use, and possible side effects and they verbalize understanding.  Diagnoses and expected course of recovery  discussed and will return if not improved as expected or if the condition worsens. Patient and/or caregiver verbalized understanding.  Prescriptions: AZITHROMYCIN 250 MG TABS (AZITHROMYCIN) Two tabs by mouth on day 1, then 1 tab daily on days 2 through 5 (Rx void after 06/10/10)  #6 tabs x 0   Entered and Authorized by:   Donna Christen MD   Signed by:   Donna Christen MD on 06/02/2010   Method used:   Print then Give to Patient   RxID:   [id number] NASONEX 50 MCG/ACT SUSP (MOMETASONE FUROATE) Two sprays in each nostril once a day  #17gm x 1   Entered and Authorized by:   Donna Christen MD   Signed by:   Donna Christen MD on 06/02/2010   Method used:   Print then Give to Patient   RxID:   [id number] PREDNISONE  10 MG TABS (PREDNISONE) 2 PO BID for 2 days, then 1 BID for 2 days, then 1 daily for 2 days.  Take PC  #14 x 0   Entered and Authorized by:   Donna Christen MD   Signed by:   Donna Christen MD on 06/02/2010   Method used:   Print then Give to Patient   RxID:   [id number]   Patient Instructions: 1)  Take Mucinex D (guaifenesin with decongestant) twice daily for congestion. 2)  Increase fluid intake. 3)  May use Afrin nasal spray (or generic oxymetazoline) twice daily for about 5 days.  Also recommend using saline nasal spray several times daily and/or saline nasal irrigation. 4)  Begin Azithromycin if not improving about 5 days or if persistent fever develops. 5)  Followup with ENT physician if not improving 7 to 10 days.   Orders Added: 1)  Tympanometry [92567] 2)  Est. Patient Level IV [14782]

## 2011-01-19 NOTE — Progress Notes (Signed)
Summary: VERTIAGO/EAR AND THROAT PAIN? (rm 3)   Vital Signs:  Patient Profile:   30 Years Old Female CC:      dizziness, left ear pain, patient seen here 2 days ago Height:     63 inches Weight:      194 pounds O2 Sat:      97 % O2 treatment:    Room Air Temp:     98.6 degrees F oral Pulse rate:   86 / minute Resp:     16 per minute BP sitting:   126 / 80  (left arm) Cuff size:   regular  Vitals Entered By: Lajean Saver RN (June 04, 2010 10:38 AM)                  Updated Prior Medication List: PROVENTIL HFA 108 (90 BASE) MCG/ACT AERS (ALBUTEROL SULFATE)  PREDNISONE 10 MG TABS (PREDNISONE) 2 PO BID for 2 days, then 1 BID for 2 days, then 1 daily for 2 days.  Take PC NASONEX 50 MCG/ACT SUSP (MOMETASONE FUROATE) Two sprays in each nostril once a day AZITHROMYCIN 250 MG TABS (AZITHROMYCIN) Two tabs by mouth on day 1, then 1 tab daily on days 2 through 5 (Rx void after 06/10/10)  Current Allergies (reviewed today): ! * SEAFOOD ! * POLLEN ! * DUST ! * MOLDHistory of Present Illness Chief Complaint: dizziness, left ear pain, patient seen here 2 days ago History of Present Illness:  Subjective:  Patient reports that since she started prednisone, she has less ear discomfort but has now developed dizziness (sensation of being off balance) worse when she bends over.  She states that she has an appointment to see her dentist.  She has developed chills.  She has not yet started antibiotic.  REVIEW OF SYSTEMS Constitutional Symptoms      Denies fever, chills, night sweats, weight loss, weight gain, and fatigue.  Eyes       Denies change in vision, eye pain, eye discharge, glasses, contact lenses, and eye surgery. Ear/Nose/Throat/Mouth       Complains of ear pain, dizziness, and sore throat.      Denies hearing loss/aids, change in hearing, ear discharge, frequent runny nose, frequent nose bleeds, sinus problems, hoarseness, and tooth pain or bleeding.  Respiratory       Denies  dry cough, productive cough, wheezing, shortness of breath, asthma, bronchitis, and emphysema/COPD.  Cardiovascular       Denies murmurs, chest pain, and tires easily with exhertion.    Gastrointestinal       Denies stomach pain, nausea/vomiting, diarrhea, constipation, blood in bowel movements, and indigestion. Genitourniary       Denies painful urination, kidney stones, and loss of urinary control. Neurological       Complains of headaches.      Denies paralysis, seizures, and fainting/blackouts. Musculoskeletal       Denies muscle pain, joint pain, joint stiffness, decreased range of motion, redness, swelling, muscle weakness, and gout.  Skin       Denies bruising, unusual mles/lumps or sores, and hair/skin or nail changes.  Psych       Denies mood changes, temper/anger issues, anxiety/stress, speech problems, depression, and sleep problems. Other Comments: Patient was seen here 2 days ago with same symptoms. Her complaint today is dizziness that has kept her for being able to work.    Past History:  Past Medical History: Reviewed history from 12/02/2009 and no changes required. Allergic rhinitis Asthma teenage depression  Past Surgical History: Reviewed history from 04/01/2009 and no changes required. Denies surgical history  Family History: Reviewed history from 12/02/2009 and no changes required. Mother, (CML) Leukemia, HTN,Diabetes,High Cholesterol, DM Father, UKN, ETOH sister asthma, HTN fam hx of depression.  Social History: Reviewed history from 12/02/2009 and no changes required. 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 Rare ETOH. Exercises 3-4 x a wk. Wt down from 260 in April. In a lesbian relationship.   Objective:  No acute distress  Eyes:  Pupils are equal, round, and reactive to light and accomdation.  Extraocular movement is intact.  Conjunctivae are not inflamed.   No nystagmus.  No photophobia Ears:  Canals normal.  Tympanic  membranes are scarred.  No erythema or fluid Nose:  Congested turbinates.  No sinus tenderess Pharynx:  Normal  Neck:  Supple.  Tender shotty posterior nodes are palpated bilaterally.  Lungs:  Clear to auscultation.  Breath sounds are equal.  Heart:  Regular rate and rhythm without murmurs, rubs, or gallops.  Assessment  Assessed EAR PAIN, BILATERAL as improved - Donna Christen MD Assessed TMJ PAIN as improved - Donna Christen MD Assessed DYSFUNCTION OF EUSTACHIAN TUBE as improved - Donna Christen MD New Problems: BENIGN POSITIONAL VERTIGO (ICD-386.11)  ? EARLY VIRAL URI  Plan New Medications/Changes: MECLIZINE HCL 25 MG TABS (MECLIZINE HCL) One by mouth two times a day to three times a day as needed for dizziness  #15 x 1, 06/04/2010, Donna Christen MD  New Orders: Est. Patient Level III [phone] Planning Comments:   Begin Antivert.  Finish prednisone.  Recommend that she begin taking Z-pack. Follow-up with ENT if not improving.   The patient and/or caregiver has been counseled thoroughly with regard to medications prescribed including dosage, schedule, interactions, rationale for use, and possible side effects and they verbalize understanding.  Diagnoses and expected course of recovery discussed and will return if not improved as expected or if the condition worsens. Patient and/or caregiver verbalized understanding.  Prescriptions: MECLIZINE HCL 25 MG TABS (MECLIZINE HCL) One by mouth two times a day to three times a day as needed for dizziness  #15 x 1   Entered and Authorized by:   Donna Christen MD   Signed by:   Donna Christen MD on 06/04/2010   Method used:   Print then Give to Patient   RxID:   [id number]   Orders Added: 1)  Est. Patient Level III [95621]

## 2011-01-19 NOTE — Telephone Encounter (Signed)
  Phone Note Outgoing Call Call back at [facility] Phone [phone]   Call placed by: Lajean Saver RN,  June 06, 2010 9:18 AM Call placed to: Patient Action Taken: Phone Call Completed Summary of Call: Callback:Patient reports her symptoms are improving, she is just feeling weak. Call with questions or concerns

## 2011-01-19 NOTE — Letter (Signed)
Summary: Out of Work  [facility]  [address]   [place], [place] [address]   Phone: [phone]  Fax: [phone]    June 04, 2010   Employee:  Maria Mack    To Whom It May Concern:   For Medical reasons, please excuse the above named employee from work today and tomorrow.   If you need additional information, please feel free to contact our office.         Sincerely,    Donna Christen MD

## 2011-01-19 NOTE — Letter (Signed)
Summary: Out of Work  [facility]  [address]   [place], [place] [address]   Phone: [phone]  Fax: [phone]    July 23, 2010   Employee:  ZHANAE PROFFIT Kawabata    To Whom It May Concern:   For Medical reasons, please excuse the above named employee from work for the following dates:  Start:   07/23/2010  Return: 07/25/10  If you need additional information, please feel free to contact our office.         Sincerely,    Clemens Catholic LPN

## 2011-01-19 NOTE — Progress Notes (Signed)
Summary: FEVER/VOMITING/LIGHT HEADED/HEADACHe rm 4   Vital Signs:  Patient Profile:   30 Years Old Female CC:      fever, V/D, light headed x today Height:     63 inches Weight:      191.75 pounds O2 Sat:      98 % O2 treatment:    Room Air Temp:     98.7 degrees F oral Pulse rate:   70 / minute Resp:     16 per minute BP sitting:   132 / 83  (left arm) Cuff size:   regular  Vitals Entered By: Clemens Catholic LPN (July 22, 1608 3:08 PM)                  Updated Prior Medication List: NORTRIPTYLINE HCL 10 MG CAPS (NORTRIPTYLINE HCL)  MEDROXYPROGESTERONE ACETATE 10 MG TABS (MEDROXYPROGESTERONE ACETATE)   Current Allergies (reviewed today): ! * SEAFOOD ! * POLLEN ! * DUST ! * MOLDHistory of Present Illness History from: patient Chief Complaint: fever, V/D, light headed x today History of Present Illness: Mild nausea, vomiting, diarrhea, and lightheadedness today.  Exposed to her sick nephew who has similar symptoms but is much worse than her.  She is mainly here because her work requires a note.  No URI symptoms, no UTI symptoms.  Mild abd pain.  REVIEW OF SYSTEMS Constitutional Symptoms       Complains of fever, chills, and night sweats.     Denies weight loss, weight gain, and fatigue.  Eyes       Complains of eye drainage.      Denies change in vision, eye pain, glasses, contact lenses, and eye surgery. Ear/Nose/Throat/Mouth       Denies hearing loss/aids, change in hearing, ear pain, ear discharge, dizziness, frequent runny nose, frequent nose bleeds, sinus problems, sore throat, hoarseness, and tooth pain or bleeding.  Respiratory       Denies dry cough, productive cough, wheezing, shortness of breath, asthma, bronchitis, and emphysema/COPD.  Cardiovascular       Denies murmurs, chest pain, and tires easily with exhertion.    Gastrointestinal       Complains of nausea/vomiting and diarrhea.      Denies stomach pain, constipation, blood in bowel movements, and  indigestion. Genitourniary       Denies painful urination, kidney stones, and loss of urinary control. Neurological       Complains of headaches.      Denies paralysis, seizures, and fainting/blackouts. Musculoskeletal       Denies muscle pain, joint pain, joint stiffness, decreased range of motion, redness, swelling, muscle weakness, and gout.  Skin       Denies bruising, unusual mles/lumps or sores, and hair/skin or nail changes.  Psych       Denies mood changes, temper/anger issues, anxiety/stress, speech problems, depression, and sleep problems. Other Comments: pt c/o N/V/D and fever x today. she has taken Pepto and Tylenol which has seemed to help. she needs a note for missing work today. she also states that she has been becoming light headed again x 1 mth.    Past History:  Past Medical History: Reviewed history from 12/02/2009 and no changes required. Allergic rhinitis Asthma teenage depression  Past Surgical History: Reviewed history from 04/01/2009 and no changes required. Denies surgical history  Family History: Reviewed history from 12/02/2009 and no changes required. Mother, (CML) Leukemia, HTN,Diabetes,High Cholesterol, DM Father, UKN, ETOH sister asthma, HTN fam hx of depression.  Social  History: Reviewed history from 12/02/2009 and no changes required. 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 Rare ETOH. Exercises 3-4 x a wk. Wt down from 260 in April. In a lesbian relationship. Physical Exam General appearance: well developed, well nourished, no acute distress Ears: normal, no lesions or deformities Nasal: mucosa pink, nonedematous, no septal deviation, turbinates normal Oral/Pharynx: tongue normal, posterior pharynx without erythema or exudate Chest/Lungs: no rales, wheezes, or rhonchi bilateral, breath sounds equal without effort Heart: regular rate and  rhythm, no murmur Abdomen: positive epigastric tenderness, abdomen soft without  obvious organomegaly MSE: oriented to time, place, and person Assessment New Problems: GASTROENTERITIS (ICD-558.9)   Plan New Orders: Est. Patient Level III [[address]] Planning Comments:   Parke Simmers diet for a few days.  No meds Rx today.  Note for 2 days off work to rest, hydrate, and recover.  Follow-up with your primary care physician if not improving or if getting worse   The patient and/or caregiver has been counseled thoroughly with regard to medications prescribed including dosage, schedule, interactions, rationale for use, and possible side effects and they verbalize understanding.  Diagnoses and expected course of recovery discussed and will return if not improved as expected or if the condition worsens. Patient and/or caregiver verbalized understanding.   Orders Added: 1)  Est. Patient Level III [[address]]

## 2011-01-19 NOTE — Letter (Signed)
Summary: Out of Work  [facility]  [address]   [place], [place] [address]   Phone: [phone]  Fax: [phone]    June 02, 2010   Employee:  Maria Mack    To Whom It May Concern:   For Medical reasons, please excuse the above named employee from work today and tomorrow.    If you need additional information, please feel free to contact our office.         Sincerely,    Donna Christen MD

## 2011-01-19 NOTE — Telephone Encounter (Signed)
  Phone Note Outgoing Call   Call placed by: Clemens Catholic LPN,  June 04, 2010 9:46 AM Call placed to: Patient Summary of Call: call back: pt states that she is no better, she went home from work today bc she was lightheaded/dizzy and she has chills. she states that she is going to come back in for another visit today. she has taken the zpak and all other meds she was rx'ed. advised her she to return to clinic today for a visit. Initial call taken by: Clemens Catholic LPN,  June 04, 2010 9:46 AM

## 2013-04-15 ENCOUNTER — Emergency Department (HOSPITAL_COMMUNITY)
Admission: EM | Admit: 2013-04-15 | Discharge: 2013-04-16 | Disposition: A | Discharge status: Home / Self Care | Payer: No Typology Code available for payment source | Attending: Emergency Medicine | Admitting: Emergency Medicine

## 2013-04-15 ENCOUNTER — Encounter (HOSPITAL_COMMUNITY): Payer: Self-pay | Admitting: Emergency Medicine

## 2013-04-15 DIAGNOSIS — R1032 Left lower quadrant pain: Secondary | ICD-10-CM | POA: Insufficient documentation

## 2013-04-15 DIAGNOSIS — R197 Diarrhea, unspecified: Secondary | ICD-10-CM | POA: Insufficient documentation

## 2013-04-15 DIAGNOSIS — R1030 Lower abdominal pain, unspecified: Secondary | ICD-10-CM

## 2013-04-15 DIAGNOSIS — Z3202 Encounter for pregnancy test, result negative: Secondary | ICD-10-CM | POA: Insufficient documentation

## 2013-04-15 DIAGNOSIS — R110 Nausea: Secondary | ICD-10-CM | POA: Insufficient documentation

## 2013-04-15 DIAGNOSIS — R51 Headache: Secondary | ICD-10-CM | POA: Insufficient documentation

## 2013-04-15 DIAGNOSIS — R630 Anorexia: Secondary | ICD-10-CM | POA: Insufficient documentation

## 2013-04-15 DIAGNOSIS — Z8659 Personal history of other mental and behavioral disorders: Secondary | ICD-10-CM | POA: Insufficient documentation

## 2013-04-15 LAB — CBC WITH DIFFERENTIAL/PLATELET
BASOS ABS: 0 10*3/uL (ref 0.0–0.1)
Basophils Relative: 0 % (ref 0–1)
EOS ABS: 0.1 10*3/uL (ref 0.0–0.7)
EOS PCT: 1 % (ref 0–5)
HCT: 43 % (ref 36.0–46.0)
Hemoglobin: 14.6 g/dL (ref 12.0–15.0)
Lymphocytes Relative: 31 % (ref 12–46)
Lymphs Abs: 2.1 10*3/uL (ref 0.7–4.0)
MCH: 30.2 pg (ref 26.0–34.0)
MCHC: 34 g/dL (ref 30.0–36.0)
MCV: 89 fL (ref 78.0–100.0)
MONO ABS: 0.6 10*3/uL (ref 0.1–1.0)
Monocytes Relative: 9 % (ref 3–12)
Neutro Abs: 3.8 10*3/uL (ref 1.7–7.7)
Neutrophils Relative %: 58 % (ref 43–77)
PLATELETS: 367 10*3/uL (ref 150–400)
RBC: 4.83 MIL/uL (ref 3.87–5.11)
RDW: 13 % (ref 11.5–15.5)
WBC: 6.5 10*3/uL (ref 4.0–10.5)

## 2013-04-15 LAB — I-STAT CHEM 8, ED
BUN: 12 mg/dL (ref 6–23)
CALCIUM ION: 1.15 mmol/L (ref 1.12–1.23)
CREATININE: 0.9 mg/dL (ref 0.50–1.10)
Chloride: 106 mEq/L (ref 96–112)
Glucose, Bld: 86 mg/dL (ref 70–99)
HCT: 46 % (ref 36.0–46.0)
HEMOGLOBIN: 15.6 g/dL — AB (ref 12.0–15.0)
Potassium: 4 mEq/L (ref 3.7–5.3)
SODIUM: 141 meq/L (ref 137–147)
TCO2: 24 mmol/L (ref 0–100)

## 2013-04-15 LAB — WET PREP, GENITAL
Trich, Wet Prep: NONE SEEN
Yeast Wet Prep HPF POC: NONE SEEN

## 2013-04-15 LAB — POC OCCULT BLOOD, ED: FECAL OCCULT BLD: NEGATIVE

## 2013-04-15 MED ORDER — MORPHINE SULFATE 4 MG/ML IJ SOLN
2.0000 mg | Freq: Once | INTRAMUSCULAR | Status: AC
  Administered 2013-04-15: 2 mg via INTRAVENOUS
  Filled 2013-04-15: qty 1

## 2013-04-15 MED ORDER — SODIUM CHLORIDE 0.9 % IV BOLUS (SEPSIS)
1000.0000 mL | Freq: Once | INTRAVENOUS | Status: AC
  Administered 2013-04-15: 1000 mL via INTRAVENOUS

## 2013-04-15 NOTE — ED Provider Notes (Signed)
CSN: [id number]     Arrival date & time 04/15/13  2101 History   First MD Initiated Contact with Patient 04/15/13 2120     Chief Complaint  Patient presents with  . Diarrhea  . Abdominal Pain    HPI  Maria Mack is a 33 y.o. female with a PMH of depression who presents to the ED for evaluation of diarrhea and abdominal pain. History was provided by the patient. Patient has had multiple episodes of diarrhea over the past week. Patient describes watery brown diarrhea with 10+ episodes daily. No hematochezia. Patient has been taking Pepto-bismul with no relief and now has had dark stools. No close contacts with similar symptoms. No recent travel. Patient initially had nausea with no emesis, but this has resolved. She also describes intermittent, cramping diffuse lower abdominal pain, which began before her diarrhea. Patient states she has been having lower unchanged abdominal pain for months, which is currently being evaluated by her PCP. Patient has not had very irregular menstrual cycles over the past year. She has not had a menstrual period in 3 months. She had lab work taken recently to evaluate for this. Has plans for follow-up for further evaluation with her PCP. No dysuria, hematuria, vaginal discharge, or vaginal bleeding. No previous abdominal surgeries. She is currently sexually active with no new sexual partners. Patient has a mild headache but denies any dizziness or lightheadedness. States "I always have a headache." Headache similar to her headache in the past.    Past Medical History  Diagnosis Date  . Allergy     rhinitis  . Depression     teenage depression   History reviewed. No pertinent past surgical history. Family History  Problem Relation Age of Onset  . Leukemia Mother     cml  . Hypertension Mother   . Hyperlipidemia Mother   . Diabetes Mother   . Alcohol abuse Father   . Asthma Sister   . Hypertension Sister   . Depression Other     family hx of   History   Substance Use Topics  . Smoking status: Never Smoker   . Smokeless tobacco: Never Used  . Alcohol Use: Yes     Comment: rare   OB History   Grav Para Term Preterm Abortions TAB SAB Ect Mult Living                 Review of Systems  Constitutional: Positive for appetite change. Negative for fever, chills, diaphoresis, activity change and fatigue.  HENT: Negative for congestion and sore throat.   Respiratory: Negative for cough and shortness of breath.   Cardiovascular: Negative for chest pain and leg swelling.  Gastrointestinal: Positive for nausea, abdominal pain and diarrhea. Negative for vomiting, constipation, blood in stool, anal bleeding and rectal pain.  Genitourinary: Negative for dysuria, urgency, hematuria, decreased urine volume, vaginal bleeding, vaginal discharge, difficulty urinating, vaginal pain, menstrual problem and pelvic pain.  Musculoskeletal: Negative for back pain and myalgias.  Neurological: Positive for headaches. Negative for dizziness, weakness and light-headedness.    Allergies  Shellfish allergy  Home Medications   Current Outpatient Rx  Name  Route  Sig  Dispense  Refill  . bismuth subsalicylate (PEPTO BISMOL) 262 MG/15ML suspension   Oral   Take 30 mLs by mouth every 6 (six) hours as needed for diarrhea or loose stools.          BP 137/98  Pulse 83  Temp(Src) 97.7 F (36.5 C) (Oral)  Resp 18  Ht 5\' 3"  (1.6 m)  Wt 217 lb (98.431 kg)  BMI 38.45 kg/m2  SpO2 97%  Filed Vitals:   04/15/13 2117 04/16/13 0137  BP: 137/98 109/70  Pulse: 83 74  Temp: 97.7 F (36.5 C)   TempSrc: Oral   Resp: 18 17  Height: 5\' 3"  (1.6 m)   Weight: 217 lb (98.431 kg)   SpO2: 97% 95%    Physical Exam  Nursing note and vitals reviewed. Constitutional: She is oriented to person, place, and time. She appears well-developed and well-nourished. No distress.  HENT:  Head: Normocephalic and atraumatic.  Right Ear: External ear normal.  Left Ear: External  ear normal.  Nose: Nose normal.  Mouth/Throat: Oropharynx is clear and moist. No oropharyngeal exudate.  Eyes: Conjunctivae are normal. Right eye exhibits no discharge. Left eye exhibits no discharge.  Neck: Normal range of motion. Neck supple.  Cardiovascular: Normal rate, regular rhythm, normal heart sounds and intact distal pulses.  Exam reveals no gallop and no friction rub.   No murmur heard. Pulmonary/Chest: Effort normal and breath sounds normal. No respiratory distress. She has no wheezes. She has no rales. She exhibits no tenderness.  Abdominal: Soft. Bowel sounds are normal. She exhibits no distension and no mass. There is tenderness. There is no rebound and no guarding.  Mild middle and left lower abdominal tenderness. No RLQ tenderness.   Musculoskeletal: Normal range of motion. She exhibits no edema and no tenderness.  No CVA, lumbar, or flank tenderness bilaterally  Neurological: She is alert and oriented to person, place, and time.  Skin: Skin is warm and dry. She is not diaphoretic.    ED Course  Procedures (including critical care time) Labs Review Labs Reviewed - No data to display Imaging Review No results found.   EKG Interpretation None      Results for orders placed during the hospital encounter of 04/15/13  WET PREP, GENITAL      Result Value Ref Range   Yeast Wet Prep HPF POC NONE SEEN  NONE SEEN   Trich, Wet Prep NONE SEEN  NONE SEEN   Clue Cells Wet Prep HPF POC FEW (*) NONE SEEN   WBC, Wet Prep HPF POC MANY (*) NONE SEEN  CBC WITH DIFFERENTIAL      Result Value Ref Range   WBC 6.5  4.0 - 10.5 K/uL   RBC 4.83  3.87 - 5.11 MIL/uL   Hemoglobin 14.6  12.0 - 15.0 g/dL   HCT 16.1  09.6 - 04.5 %   MCV 89.0  78.0 - 100.0 fL   MCH 30.2  26.0 - 34.0 pg   MCHC 34.0  30.0 - 36.0 g/dL   RDW 40.9  81.1 - 91.4 %   Platelets 367  150 - 400 K/uL   Neutrophils Relative % 58  43 - 77 %   Neutro Abs 3.8  1.7 - 7.7 K/uL   Lymphocytes Relative 31  12 - 46 %    Lymphs Abs 2.1  0.7 - 4.0 K/uL   Monocytes Relative 9  3 - 12 %   Monocytes Absolute 0.6  0.1 - 1.0 K/uL   Eosinophils Relative 1  0 - 5 %   Eosinophils Absolute 0.1  0.0 - 0.7 K/uL   Basophils Relative 0  0 - 1 %   Basophils Absolute 0.0  0.0 - 0.1 K/uL  URINALYSIS, ROUTINE W REFLEX MICROSCOPIC      Result Value Ref Range  Color, Urine GREEN (*) YELLOW   APPearance CLOUDY (*) CLEAR   Specific Gravity, Urine 1.035 (*) 1.005 - 1.030   pH 6.5  5.0 - 8.0   Glucose, UA NEGATIVE  NEGATIVE mg/dL   Hgb urine dipstick MODERATE (*) NEGATIVE   Bilirubin Urine NEGATIVE  NEGATIVE   Ketones, ur NEGATIVE  NEGATIVE mg/dL   Protein, ur NEGATIVE  NEGATIVE mg/dL   Urobilinogen, UA 0.2  0.0 - 1.0 mg/dL   Nitrite NEGATIVE  NEGATIVE   Leukocytes, UA NEGATIVE  NEGATIVE  PREGNANCY, URINE      Result Value Ref Range   Preg Test, Ur NEGATIVE  NEGATIVE  URINE MICROSCOPIC-ADD ON      Result Value Ref Range   Squamous Epithelial / LPF RARE  RARE   WBC, UA 0-2  <3 WBC/hpf   RBC / HPF 7-10  <3 RBC/hpf   Bacteria, UA RARE  RARE   Crystals CA OXALATE CRYSTALS (*) NEGATIVE  I-STAT CHEM 8, ED      Result Value Ref Range   Sodium 141  137 - 147 mEq/L   Potassium 4.0  3.7 - 5.3 mEq/L   Chloride 106  96 - 112 mEq/L   BUN 12  6 - 23 mg/dL   Creatinine, Ser 4.09  0.50 - 1.10 mg/dL   Glucose, Bld 86  70 - 99 mg/dL   Calcium, Ion 8.11  9.14 - 1.23 mmol/L   TCO2 24  0 - 100 mmol/L   Hemoglobin 15.6 (*) 12.0 - 15.0 g/dL   HCT 78.2  95.6 - 21.3 %  POC OCCULT BLOOD, ED      Result Value Ref Range   Fecal Occult Bld NEGATIVE  NEGATIVE    MDM   DENEICE WACK is a 33 y.o. female with a PMH of depression who presents to the ED for evaluation of diarrhea and abdominal pain.   Rechecks  11:40 PM = Pelvic exam performed at bedside. Minimal amount of thin white discharge present in the vaginal vault. No CMT or adnexal tenderness bilaterally. Minimal vaginal bleeding from swabs. Rectal exam revealed no gross blood  in the rectal vault. No external fissures or hemorrhoids. No palpable internal masses or hemorrhoids in the rectal vault. Minimal amount of stool.  1:40 AM = Patient states she is ready for discharge. Has a mild headache. Abdominal exam resolved. Repeat abdominal exam benign.      Patient evaluated for a 1 week hx of diarrhea which may be due to a gastroenteritis. Hemoccult negative. Labs unremarkable. Patient also complained of lower abdominal pain, which occurred before the onset of her diarrhea and is being managed by her PCP. Pelvic exam unremarkable. Patient had a few clue cells on her wet mount. Spoke with patient regarding results and the decision was not to treat at this time. Moderate amount of blood on UA, which was collected after pelvic exam and may be due to bleeding which occurred after swab collection. Patient has follow-up scheduled with her PCP. Instructed to drink plenty of fluids. Return precautions, discharge instructions, and follow-up was discussed with the patient before discharge.     Discharge Medication List as of 04/16/2013  1:42 AM    START taking these medications   Details  loperamide (IMODIUM) 2 MG capsule Take 1 capsule (2 mg total) by mouth 4 (four) times daily as needed for diarrhea or loose stools., Starting 04/16/2013, Until Discontinued, Print         Final impressions: 1. Diarrhea  2. Lower abdominal pain       Greer EeJessica Katlin Dexter Sauser PA-C           Jillyn LedgerJessica K Canuto [place], [place] JerseyPA-C 04/17/13 1102

## 2013-04-15 NOTE — ED Notes (Signed)
Pt reports having abdominal pain and diarrhea for the past week, reports she was nauseated for the first two days, but no longer. Pt reports more than 10 episodes of diarrhea in the past 24 hours.

## 2013-04-15 NOTE — ED Notes (Signed)
Urine specimen contaminated and insufficient amount, wasted at this time.

## 2013-04-16 LAB — URINE MICROSCOPIC-ADD ON

## 2013-04-16 LAB — URINALYSIS, ROUTINE W REFLEX MICROSCOPIC
Bilirubin Urine: NEGATIVE
Glucose, UA: NEGATIVE mg/dL
KETONES UR: NEGATIVE mg/dL
Leukocytes, UA: NEGATIVE
Nitrite: NEGATIVE
PROTEIN: NEGATIVE mg/dL
Specific Gravity, Urine: 1.035 — ABNORMAL HIGH (ref 1.005–1.030)
UROBILINOGEN UA: 0.2 mg/dL (ref 0.0–1.0)
pH: 6.5 (ref 5.0–8.0)

## 2013-04-16 LAB — PREGNANCY, URINE: PREG TEST UR: NEGATIVE

## 2013-04-16 MED ORDER — LOPERAMIDE HCL 2 MG PO CAPS: 2.0000 mg | ORAL_CAPSULE | Freq: Four times a day (QID) | ORAL | Status: DC | PRN

## 2013-04-16 NOTE — Discharge Instructions (Signed)
Try imodium for diarrhea  Drink plenty of fluids and rest  Follow up with your primary doctor for continued evaluation of your abdominal pain and diarrhea  Return to the emergency department if you develop any changing/worsening condition, repeated vomiting, blood in your stool/vomit/urine, fever, or any other concerns (please read additional information regarding your condition below)    Diarrhea Diarrhea is frequent loose and watery bowel movements. It can cause you to feel weak and dehydrated. Dehydration can cause you to become tired and thirsty, have a dry mouth, and have decreased urination that often is dark yellow. Diarrhea is a sign of another problem, most often an infection that will not last long. In most cases, diarrhea typically lasts 2 3 days. However, it can last longer if it is a sign of something more serious. It is important to treat your diarrhea as directed by your caregive to lessen or prevent future episodes of diarrhea. CAUSES  Some common causes include:  Gastrointestinal infections caused by viruses, bacteria, or parasites.  Food poisoning or food allergies.  Certain medicines, such as antibiotics, chemotherapy, and laxatives.  Artificial sweeteners and fructose.  Digestive disorders. HOME CARE INSTRUCTIONS  Ensure adequate fluid intake (hydration): have 1 cup (8 oz) of fluid for each diarrhea episode. Avoid fluids that contain simple sugars or sports drinks, fruit juices, whole milk products, and sodas. Your urine should be clear or pale yellow if you are drinking enough fluids. Hydrate with an oral rehydration solution that you can purchase at pharmacies, retail stores, and online. You can prepare an oral rehydration solution at home by mixing the following ingredients together:    tsp table salt.   tsp baking soda.   tsp salt substitute containing potassium chloride.  1  tablespoons sugar.  1 L (34 oz) of water.  Certain foods and beverages may  increase the speed at which food moves through the gastrointestinal (GI) tract. These foods and beverages should be avoided and include:  Caffeinated and alcoholic beverages.  High-fiber foods, such as raw fruits and vegetables, nuts, seeds, and whole grain breads and cereals.  Foods and beverages sweetened with sugar alcohols, such as xylitol, sorbitol, and mannitol.  Some foods may be well tolerated and may help thicken stool including:  Starchy foods, such as rice, toast, pasta, low-sugar cereal, oatmeal, grits, baked potatoes, crackers, and bagels.  Bananas.  Applesauce.  Add probiotic-rich foods to help increase healthy bacteria in the GI tract, such as yogurt and fermented milk products.  Wash your hands well after each diarrhea episode.  Only take over-the-counter or prescription medicines as directed by your caregiver.  Take a warm bath to relieve any burning or pain from frequent diarrhea episodes. SEEK IMMEDIATE MEDICAL CARE IF:   You are unable to keep fluids down.  You have persistent vomiting.  You have blood in your stool, or your stools are black and tarry.  You do not urinate in 6 8 hours, or there is only a small amount of very dark urine.  You have abdominal pain that increases or localizes.  You have weakness, dizziness, confusion, or lightheadedness.  You have a severe headache.  Your diarrhea gets worse or does not get better.  You have a fever or persistent symptoms for more than 2 3 days.  You have a fever and your symptoms suddenly get worse. MAKE SURE YOU:   Understand these instructions.  Will watch your condition.  Will get help right away if you are not doing well  or get worse. Document Released: 01/23/2002 Document Revised: 01/20/2012 Document Reviewed: 10/11/2011 [facility] Patient Information 2014 [place], [place].  Abdominal Pain, Adult Many things can cause abdominal pain. Usually, abdominal pain is not caused by a disease and will  improve without treatment. It can often be observed and treated at home. Your health care provider will do a physical exam and possibly order blood tests and X-rays to help determine the seriousness of your pain. However, in many cases, more time must pass before a clear cause of the pain can be found. Before that point, your health care provider may not know if you need more testing or further treatment. HOME CARE INSTRUCTIONS  Monitor your abdominal pain for any changes. The following actions may help to alleviate any discomfort you are experiencing:  Only take over-the-counter or prescription medicines as directed by your health care provider.  Do not take laxatives unless directed to do so by your health care provider.  Try a clear liquid diet (broth, tea, or water) as directed by your health care provider. Slowly move to a bland diet as tolerated. SEEK MEDICAL CARE IF:  You have unexplained abdominal pain.  You have abdominal pain associated with nausea or diarrhea.  You have pain when you urinate or have a bowel movement.  You experience abdominal pain that wakes you in the night.  You have abdominal pain that is worsened or improved by eating food.  You have abdominal pain that is worsened with eating fatty foods. SEEK IMMEDIATE MEDICAL CARE IF:   Your pain does not go away within 2 hours.  You have a fever.  You keep throwing up (vomiting).  Your pain is felt only in portions of the abdomen, such as the right side or the left lower portion of the abdomen.  You pass bloody or black tarry stools. MAKE SURE YOU:  Understand these instructions.   Will watch your condition.   Will get help right away if you are not doing well or get worse.  Document Released: 11/12/2004 Document Revised: 11/23/2012 Document Reviewed: 10/12/2012 [facility] Patient Information 2014 [place], [place].   Emergency Department Resource Guide 1) Find a Doctor and Pay Out of Pocket Although  you won't have to find out who is covered by your insurance plan, it is a good idea to ask around and get recommendations. You will then need to call the office and see if the doctor you have chosen will accept you as a new patient and what types of options they offer for patients who are self-pay. Some doctors offer discounts or will set up payment plans for their patients who do not have insurance, but you will need to ask so you aren't surprised when you get to your appointment.  2) Contact Your Local Health Department Not all health departments have doctors that can see patients for sick visits, but many do, so it is worth a call to see if yours does. If you don't know where your local health department is, you can check in your phone book. The CDC also has a tool to help you locate your state's health department, and many state websites also have listings of all of their local health departments.  3) Find a Walk-in Clinic If your illness is not likely to be very severe or complicated, you may want to try a walk in clinic. These are popping up all over the country in pharmacies, drugstores, and shopping centers. They're usually staffed by nurse practitioners or physician assistants  that have been trained to treat common illnesses and complaints. They're usually fairly quick and inexpensive. However, if you have serious medical issues or chronic medical problems, these are probably not your best option.  No Primary Care Doctor: - Call Health Connect at  [phone] - they can help you locate a primary care doctor that  accepts your insurance, provides certain services, etc. - Physician Referral Service- [phone]  Chronic Pain Problems: Organization         Address  Phone   Notes  [facility]  [phone] Patients need to be referred by their primary care doctor.   Medication Assistance: Organization         Address  Phone   Notes  [facility] Medication [facility] [address] [place] [place], [place] [address] [phone] --Must be a resident of [facility] -- Must have NO insurance coverage whatsoever (no Medicaid/ Medicare, etc.) -- The pt. MUST have a primary care doctor that directs their care regularly and follows them in the community   [facility]  [phone]   [employer]  [phone]    Agencies that provide inexpensive medical care: Organization         Address  Phone   Notes  [facility]  [phone]   [facility]    [phone]   [facility] [address] [place], [place] [address] [phone]   [facility] [address] [place] [address], [place] [phone]   Planned Parenthood    [phone]   [facility]    [phone]   [facility] and [facility]  [address], [place] Phone:  [phone], Fax:  [phone] Hours of Operation:  9 am - 6 pm, M-F.  Also accepts Medicaid/Medicare and self-pay.  [facility]  [address], [place] Phone: [phone], Fax: [phone]. Hours of Operation:  8:30 am - 5:30 pm, M-F.  Also accepts Medicaid and self-pay.  [facility] [place] [address], [place] Phone: [phone]   [facility] [address] Natasha Bence [place], [place] [phone], Ext. [phone] Mondays & Thursdays: 7-9 AM.  First 15 patients are seen on a first come, first serve basis.    Medicaid-accepting [facility] Providers:  Organization         Address  Phone   Notes  [facility] [address], [place] [phone] Also accepts self-pay patients.  [facility] [address] Laurell Josephs [place], [place]  [phone]   [facility] [address], [place] [phone]   [facility] [address], [place] [phone]   Renaye Rakers [address], [place]   [phone] Only accepts [place] Access [place] patients after they have their name applied to their card.   Self-Pay (no insurance) in [facility]:  Organization         Address  Phone   Notes  Sickle Cell Patients, [facility] [address] [place], [place] [phone]   [facility] [address] [place], [place] [phone]   [facility] [place]  [address], [place] [phone]   [facility]/Dr. Osei-Bonsu  [address] [place] [address],  [facility] [place] or [address], [place] [phone] Phone number for both [place] and [place] locations is the same.  [facility]gent [facility] and [facility] [address], [place] [phone]) [phone]   [facility] [place] [address] [place] [address], [place] or [address] [phone]) [phone]   [facility] [address], [place] [phone], phone; [phone]) [phone], fax Sees patients 1st and 3rd Saturday of every month.  Must not qualify for public or private insurance (i.e. Medicaid, Medicare, [place] Health Choice, Veterans' Benefits)  Household income should be no more than 200% of the poverty level The clinic cannot treat you if you are pregnant or think you are pregnant  Sexually transmitted diseases are not treated at the clinic.    Dental Care: Organization         Address  Phone  Notes  [facility] [address], [place] [phone] Accepts children up to age 31 who are enrolled in [place] or [place] Health Choice; pregnant women with a Medicaid card; and children who have applied for Medicaid or [place] Health Choice, but were declined, whose parents can pay a reduced fee at time of service.  [facility] [place]  [address], [place] [phone] Accepts  children up to age 31 who are enrolled in [place] or [place] Health Choice; pregnant women with a Medicaid card; and children who have applied for Medicaid or [place] Health Choice, but were declined, whose parents can pay a reduced fee at time of service.  [facility]  [address], [place] [phone]) [phone] Patients are seen by appointment only. Walk-ins are not accepted. [facility] will see patients 31 years of age and older. Monday - Tuesday (8am-5pm) Most Wednesdays (8:30-5pm) $30 per visit, cash only  [facility]  [address], [facility] [phone] Patients are seen by appointment only. Walk-ins are not accepted. [facility] will see patients 31 years of age and older. One Wednesday Evening (Monthly: Volunteer Based).  $30 per visit, cash only  [employer] [facility] [facility]  [phone] for adults; Children under age 934, call Graduate Pediatric Dentistry at [phone]. Children aged 154-14, please call [phone] to request a pediatric application.  Dental services are provided in all areas of dental care including fillings, crowns and bridges, complete and partial dentures, implants, gum treatment, root canals, and extractions. Preventive care is also provided. Treatment is provided to both adults and children. Patients are selected via a lottery and there is often a waiting list.   [facility] [address], [place]  [phone]) [phone] [email or web address]   [facility] [address] [place] [address], [place], [place] [phone], Ext. [phone] Second and Fourth Thursday of each month, opens at 6:30 AM; Clinic ends at 9 AM.  Patients are seen on a first-come first-served basis, and a limited number are seen during each clinic.   [facility]  [address], [place], [place] [phone]) [phone]   Eligibility Requirements You must have lived in [place], [place], or [place] counties for at least the  last three months.   You cannot be eligible for state or federal sponsored [place]healthcare insurance, including CIGNAVeterans Administration, [place]icaid, or [employer]icare.   You generally cannot be eligible for healthcare insurance through your employer.    How to apply:  Eligibility screenings are held every Tuesday and Wednesday afternoon from 1:00 pm until 4:00 pm. You do not need an appointment for the interview!  [facility] [address] [place], [place] [phone]   [facility] Health Department  [phone]   [facility] Health Department  [phone]   [facility] Health Department  [phone]    Behavioral Health Resources in the Community: Intensive Outpatient Programs Organization         Address  Phone  Notes  [facility] Services [address], [place], [place] [phone]   [facility] Outpatient [address] [place], [place] [phone]   [facility] [address], [place], [place]  [phone]   [facility] [address],  [place], [place] [phone] or [phone])[phone]   Substance Abuse Resources Organization         Address  Phone  Notes  [facility]  [phone]   [facility]  [phone]   [facility]  [phone]   Floydene Flock  [phone]   Residential & Outpatient Substance Abuse Program  [phone]   Psychological Services Organization         Address  Phone  Notes  [facility] Behavior[facility] Health  [phone]   [facility] Services  [phone]   [facility] [address], [place] [phone] or [phone]    Mobile Crisis Teams Organization         Address  Phone  Notes  [facility], Mobile Crisis Care Unit  [phone]   [facility]  [address] [place], [place] [phone]   Doristine Locks [address] [place] [phone]     Self-Help/Support Groups Organization         Address  Phone             Notes  [facility] [place] - variety of support groups  336- I7437963 Call for more information  Narcotics Anonymous (NA), [facility] [address], [employer] [place]  2 meetings at this location   Statistician         Address  Phone  Notes  [facility] [address],    [place]  [phone]   [facility]  [address], [place] [address], [place], [place] [phone]   [facility] [address] [place], [place] [phone] Admissions: 8am-3pm M-F  [facility] [address]    [place], [place] [phone]   [facility] [address] [place], [place], [place] [phone]   [facility] [address],  [place], [place] [phone]   [facility] - Intensive Outpatient [address]ure[address], [place], [place] [phone]   [facility] ([facility] [address] [place]  [place], [place] [phone] or [phone]   [facility] ([facility]) [address], [place], [place] [phone] Accepts Medicaid  [facility] [address],  [place] [phone] Substance Abuse/Addiction Treatment   [facility] Organization         Address  Phone  Notes  [facility]  [phone]   Angie Fava, PhD [address] Ervin Knack [place], [place]   [phone] or [phone]   [facility] Behavioral   [address] [place], [place] [phone]   [facility] [address], [place], [place] [phone] Insurance/Medicaid/sponsorship through [facility] [address]                                    [place], [place] [phone] Therapy/tele-psych/case  [facility] [address]   [place], [place] [phone]    Dr. Lolly Mustache  [phone]   Free  [facility] [place] [facility]  [employer] [facility] Dept. 1) [address] [place] 2) [address], [place] 3)  [address], [place] [phone]  [phone]   [facility] Child Abuse Hotline 430-851-3063 or 2056381666 (After Hours)

## 2013-04-17 LAB — GC/CHLAMYDIA PROBE AMP
CT PROBE, AMP APTIMA: NEGATIVE
GC PROBE AMP APTIMA: NEGATIVE

## 2013-04-19 NOTE — ED Provider Notes (Signed)
Medical screening examination/treatment/procedure(s) were performed by non-physician practitioner and as supervising physician I was immediately available for consultation/collaboration.   EKG Interpretation None       Durwood Dittus R. Jeryn Bertoni, MD 04/19/13 1249 

## 2013-04-26 ENCOUNTER — Encounter: Payer: Self-pay | Admitting: Physician Assistant

## 2013-04-26 ENCOUNTER — Ambulatory Visit (INDEPENDENT_AMBULATORY_CARE_PROVIDER_SITE_OTHER): Payer: No Typology Code available for payment source | Admitting: Physician Assistant

## 2013-04-26 VITALS — BP 124/84 | HR 80 | Temp 98.2°F | Resp 16 | Ht 63.0 in | Wt 272.4 lb

## 2013-04-26 DIAGNOSIS — R05 Cough: Secondary | ICD-10-CM

## 2013-04-26 DIAGNOSIS — H6690 Otitis media, unspecified, unspecified ear: Secondary | ICD-10-CM

## 2013-04-26 DIAGNOSIS — R059 Cough, unspecified: Secondary | ICD-10-CM

## 2013-04-26 DIAGNOSIS — B09 Unspecified viral infection characterized by skin and mucous membrane lesions: Secondary | ICD-10-CM

## 2013-04-26 LAB — POCT CBC
GRANULOCYTE PERCENT: 55.5 % (ref 37–80)
HCT, POC: 45.5 % (ref 37.7–47.9)
Hemoglobin: 14.6 g/dL (ref 12.2–16.2)
LYMPH, POC: 2.3 (ref 0.6–3.4)
MCH, POC: 29.9 pg (ref 27–31.2)
MCHC: 32.1 g/dL (ref 31.8–35.4)
MCV: 93.1 fL (ref 80–97)
MID (CBC): 0.5 (ref 0–0.9)
MPV: 10.3 fL (ref 0–99.8)
PLATELET COUNT, POC: 345 10*3/uL (ref 142–424)
POC GRANULOCYTE: 3.6 (ref 2–6.9)
POC LYMPH %: 36.1 % (ref 10–50)
POC MID %: 8.4 % (ref 0–12)
RBC: 4.89 M/uL (ref 4.04–5.48)
RDW, POC: 13 %
WBC: 6.4 10*3/uL (ref 4.6–10.2)

## 2013-04-26 LAB — POCT INFLUENZA A/B
INFLUENZA A, POC: NEGATIVE
INFLUENZA B, POC: NEGATIVE

## 2013-04-26 MED ORDER — AMOXICILLIN-POT CLAVULANATE 875-125 MG PO TABS: 1.0000 | ORAL_TABLET | Freq: Two times a day (BID) | ORAL | Status: DC

## 2013-04-26 MED ORDER — IPRATROPIUM BROMIDE 0.06 % NA SOLN: 2.0000 | Freq: Three times a day (TID) | NASAL | Status: DC

## 2013-04-26 MED ORDER — HYDROCOD POLST-CHLORPHEN POLST 10-8 MG/5ML PO LQCR: 5.0000 mL | Freq: Two times a day (BID) | ORAL | Status: DC | PRN

## 2013-04-26 NOTE — Progress Notes (Signed)
Subjective:    Patient ID: Maria Mack, female    DOB: 1980/10/16, 33 y.o.   MRN: [id number]  HPI Primary Physician: Seymour BarsBOWEN, KAREN, DO  Chief Complaint: URI x 6 days  HPI: 33 y.o. female with history below presents with 6 day history of ST, sinus pressure, congestion, otalgia, muffled hearing, post nasal drip, cough, headache, chills, and fever. T max 102. She had a temp of 101.5 this morning. Cough is is productive and worse at night. Some SOB and wheezing. Nasal congestion over chest congestion. Headache is located along the frontal sinus. No myalgias. She developed a pruritic mildly erythematous rash along her bilateral lower leg that starts at her hips and stops just above her knees. No new medications except for the OTC cold medications. No new soaps or detergents. No new clothes.   Her partner was just treated for whooping cough. Patient believes that her TDaP is up to date.      Past Medical History  Diagnosis Date  . Allergy     rhinitis  . Depression     teenage depression     Home Meds: Prior to Admission medications   Not on File    Allergies:  Allergies  Allergen Reactions  . Shellfish Allergy Other (See Comments)    Flu-like symptoms    History   Social History  . Marital Status: Single    Spouse Name: N/A    Number of Children: N/A  . Years of Education: N/A   Occupational History  . Not on file.   Social History Main Topics  . Smoking status: Never Smoker   . Smokeless tobacco: Never Used  . Alcohol Use: Yes     Comment: rare  . Drug Use: No  . Sexual Activity: Not on file     Comment: in a lesbian relationship   Other Topics Concern  . Not on file   Social History Narrative  . No narrative on file     Review of Systems  Constitutional: Positive for fever, chills and fatigue. Negative for appetite change.       T max 102 Had 101.5   HENT: Positive for congestion, ear pain, hearing loss, postnasal drip, rhinorrhea, sinus pressure  and sore throat.        Nasal congestion.  Slightly muffled hearing.   Respiratory: Positive for cough, shortness of breath and wheezing.        Cough is productive. Cough is worse at night.   Gastrointestinal: Negative for nausea, vomiting and diarrhea.  Musculoskeletal: Negative for myalgias.  Skin: Positive for rash.       Rash is pruritic. First noticed rash 04/25/13  Neurological: Positive for headaches.       Frontal headache.       Objective:   Physical Exam  Physical Exam: Blood pressure 124/84, pulse 80, temperature 98.2 F (36.8 C), temperature source Oral, resp. rate 16, height 5\' 3"  (1.6 m), weight 272 lb 6.4 oz (123.56 kg), SpO2 98.00%., Body mass index is 48.27 kg/(m^2). General: Well developed, well nourished, in no acute distress. Head: Normocephalic, atraumatic, eyes without discharge, sclera non-icteric, nares are congested. Bilateral auditory canals clear, TM's are without perforation, right TM pearly grey and translucent with reflective cone of light. Left TM erythematous with increased fluid. No perforation bilaterally. Oral cavity moist, posterior pharynx with post nasal drip. No exudate, erythema, or peritonsillar abscess. Uvula midline.  Neck: Supple. No thyromegaly. Full ROM. No lymphadenopathy. No nuchal rigidity.  Lungs:  Clear bilaterally to auscultation without wheezes, rales, or rhonchi. Breathing is unlabored. Heart: RRR with S1 S2. No murmurs, rubs, or gallops appreciated. Msk:  Strength and tone normal for age. Extremities/Skin: Warm and dry. No clubbing or cyanosis. No edema. No suspicious lesions. Bilateral lower extremities with several mildly erythematous urticarial lesions from from the hips to the knees.  Neuro: Alert and oriented X 3. Moves all extremities spontaneously. Gait is normal. CNII-XII grossly in tact. Psych:  Responds to questions appropriately with a normal affect.   Labs: Results for orders placed in visit on 04/26/13  POCT CBC       Result Value Ref Range   WBC 6.4  4.6 - 10.2 K/uL   Lymph, poc 2.3  0.6 - 3.4   POC LYMPH PERCENT 36.1  10 - 50 %L   MID (cbc) 0.5  0 - 0.9   POC MID % 8.4  0 - 12 %M   POC Granulocyte 3.6  2 - 6.9   Granulocyte percent 55.5  37 - 80 %G   RBC 4.89  4.04 - 5.48 M/uL   Hemoglobin 14.6  12.2 - 16.2 g/dL   HCT, POC 11.9  14.7 - 47.9 %   MCV 93.1  80 - 97 fL   MCH, POC 29.9  27 - 31.2 pg   MCHC 32.1  31.8 - 35.4 g/dL   RDW, POC 82.9     Platelet Count, POC 345  142 - 424 K/uL   MPV 10.3  0 - 99.8 fL  POCT INFLUENZA A/B      Result Value Ref Range   Influenza A, POC Negative     Influenza B, POC Negative         Assessment & Plan:  33 year old female with AOM, viral rash, and cough -Augmentin 875/125 mg 1 po bid #20 no RF -Tussionex 1 tsp po q 12 hours prn cough #90 mL no RF -Atrovent NS 0.06% 2 sprays each nare bid prn #1 no RF -Rest/fluids -RTC precautions   Eula Listen, MHS, PA-C [facility] [address] [place], [place] [address] [phone] [facility] 04/26/2013 8:59 PM

## 2013-05-15 ENCOUNTER — Encounter (HOSPITAL_COMMUNITY): Payer: Self-pay | Admitting: Emergency Medicine

## 2013-05-15 ENCOUNTER — Emergency Department (HOSPITAL_COMMUNITY)
Admission: EM | Admit: 2013-05-15 | Discharge: 2013-05-15 | Disposition: A | Discharge status: Home / Self Care | Payer: No Typology Code available for payment source | Attending: Emergency Medicine | Admitting: Emergency Medicine

## 2013-05-15 DIAGNOSIS — J45909 Unspecified asthma, uncomplicated: Secondary | ICD-10-CM | POA: Insufficient documentation

## 2013-05-15 DIAGNOSIS — F3289 Other specified depressive episodes: Secondary | ICD-10-CM | POA: Insufficient documentation

## 2013-05-15 DIAGNOSIS — F329 Major depressive disorder, single episode, unspecified: Secondary | ICD-10-CM | POA: Insufficient documentation

## 2013-05-15 DIAGNOSIS — L259 Unspecified contact dermatitis, unspecified cause: Secondary | ICD-10-CM | POA: Insufficient documentation

## 2013-05-15 DIAGNOSIS — L309 Dermatitis, unspecified: Secondary | ICD-10-CM

## 2013-05-15 HISTORY — DX: Unspecified asthma, uncomplicated: J45.909

## 2013-05-15 MED ORDER — PREDNISONE 20 MG PO TABS: 40.0000 mg | ORAL_TABLET | Freq: Every day | ORAL | Status: AC

## 2013-05-15 MED ORDER — HYDROXYZINE HCL 25 MG PO TABS: 25.0000 mg | ORAL_TABLET | Freq: Four times a day (QID) | ORAL | Status: AC

## 2013-05-15 NOTE — ED Provider Notes (Signed)
CSN: [id number]     Arrival date & time 05/15/13  1621 History  This chart was scribed for non-physician practitioner, Junius FinnerErin O'Malley, PA-C, working with Juliet RudeNathan R. Rubin PayorPickering, MD by Shari HeritageAisha Amuda, ED Scribe. This patient was seen in room WTR8/WTR8 and the patient's care was started at 6:38 PM.   Chief Complaint  Patient presents with  . Rash     The history is provided by the patient. No language interpreter was used.    HPI Comments: Lanny HurstWendy M Jou is a 33 y.o. female who presents to the Emergency Department complaining of a worsening, erythematous, pruritic, diffuse rash to her arms, legs, feet, hands, trunk and back that began 1 week ago. She says that she experiences burning pain in some rash areas, but it is mostly itchy. Patient states that she has multiple allergies, but she thinks her current rash may be due to mold in her apartment. She has tried Benadryl and other OTC medicines for allergic reactions without relief. Patient has had contact dermatitis before and episodes have usually resolved after a few days, but she states that this episode has been her most severe. She has not used any new soaps, lotions or detergents. She denies fever, shortness of breath or nausea. She has a medical history of asthma. Patient is not pregnant and is not breastfeeding.  PCP - Primus Bravoiffany Gibson, NP  Past Medical History  Diagnosis Date  . Allergy     rhinitis  . Depression     teenage depression  . Asthma    History reviewed. No pertinent past surgical history. Family History  Problem Relation Age of Onset  . Leukemia Mother     cml  . Hypertension Mother   . Hyperlipidemia Mother   . Diabetes Mother   . Alcohol abuse Father   . Asthma Sister   . Hypertension Sister   . Depression Other     family hx of   History  Substance Use Topics  . Smoking status: Never Smoker   . Smokeless tobacco: Never Used  . Alcohol Use: No     Comment: rare   OB History   Grav Para Term Preterm  Abortions TAB SAB Ect Mult Living                 Review of Systems  Respiratory: Negative for shortness of breath.   Gastrointestinal: Negative for nausea.  Skin: Positive for rash.  All other systems reviewed and are negative.      Allergies  Shellfish allergy  Home Medications   Current Outpatient Rx  Name  Route  Sig  Dispense  Refill  . cetirizine (ZYRTEC) 10 MG tablet   Oral   Take 10 mg by mouth daily.         . diphenhydrAMINE (BENADRYL) 25 mg capsule   Oral   Take 25 mg by mouth every 6 (six) hours as needed for allergies.         . hydrOXYzine (ATARAX/VISTARIL) 25 MG tablet   Oral   Take 1 tablet (25 mg total) by mouth every 6 (six) hours.   12 tablet   0   . predniSONE (DELTASONE) 20 MG tablet   Oral   Take 2 tablets (40 mg total) by mouth daily.   10 tablet   0    Triage Vitals: BP 152/102  Pulse 97  Temp(Src) 98.1 F (36.7 C) (Oral)  Resp 18  SpO2 99% Physical Exam  Nursing note and vitals reviewed. Constitutional:  She is oriented to person, place, and time. She appears well-developed and well-nourished.  HENT:  Head: Normocephalic and atraumatic.  Eyes: EOM are normal.  Neck: Normal range of motion.  Cardiovascular: Normal rate.   Pulmonary/Chest: Effort normal.  Musculoskeletal: Normal range of motion.  Neurological: She is alert and oriented to person, place, and time.  Skin: Skin is warm and dry. Rash noted. Rash is macular and papular. There is erythema.  Diffuse macular papular erythematous rash on arms, legs, trunk and back. No evidence of underlying infection. No lesions between web spacing. No track marks.  Psychiatric: She has a normal mood and affect. Her behavior is normal.    ED Course  Procedures (including critical care time) DIAGNOSTIC STUDIES: Oxygen Saturation is 99% on room air, normal by my interpretation.    COORDINATION OF CARE: 6:41 PM- Patient presents with contact dermatitis. Will prescribe prednisone and  Atarax. Advised her to use hydrocortisone cream at home for itching relief. Patient informed of current plan for treatment and evaluation and agrees with plan at this time.    MDM   Final diagnoses:  Dermatitis    Pt presenting with rash that does not appear infectious or due to anaphylaxis. Do not believe further workup needed at this time. Will tx symptomatically. Rx: prednisone and atarax. Advised to f/u with PCP, may need referral to dermatologist. Return precautions provided. Pt verbalized understanding and agreement with tx plan.   I personally performed the services described in this documentation, which was scribed in my presence. The recorded information has been reviewed and is accurate.   Junius Finner, PA-C 05/15/13 [phone]

## 2013-05-15 NOTE — ED Notes (Addendum)
Pt has rash over entire body x 1 week that is "itchy and sometimes burns". Pt reports mold in her apartment and reports she is sensitive to different things but has not changed in soap, detergents or shampoos. NAD. Breathing is normal and unlabored. Denies fever or n/v/d.

## 2013-05-16 NOTE — ED Provider Notes (Signed)
Medical screening examination/treatment/procedure(s) were performed by non-physician practitioner and as supervising physician I was immediately available for consultation/collaboration.   EKG Interpretation None       Catherina Pates R. Raissa Dam, MD 05/16/13 0020 

## 2019-03-14 ENCOUNTER — Encounter (HOSPITAL_COMMUNITY): Payer: Self-pay | Admitting: *Deleted

## 2019-03-14 ENCOUNTER — Other Ambulatory Visit: Payer: Self-pay

## 2019-03-14 ENCOUNTER — Emergency Department (HOSPITAL_COMMUNITY)
Admission: EM | Admit: 2019-03-14 | Discharge: 2019-03-14 | Disposition: A | Discharge status: Home / Self Care | Payer: Self-pay | Attending: Emergency Medicine | Admitting: Emergency Medicine

## 2019-03-14 ENCOUNTER — Emergency Department (HOSPITAL_COMMUNITY): Payer: Self-pay

## 2019-03-14 DIAGNOSIS — Y9389 Activity, other specified: Secondary | ICD-10-CM | POA: Insufficient documentation

## 2019-03-14 DIAGNOSIS — Y999 Unspecified external cause status: Secondary | ICD-10-CM | POA: Insufficient documentation

## 2019-03-14 DIAGNOSIS — Y9289 Other specified places as the place of occurrence of the external cause: Secondary | ICD-10-CM | POA: Insufficient documentation

## 2019-03-14 DIAGNOSIS — J45909 Unspecified asthma, uncomplicated: Secondary | ICD-10-CM | POA: Insufficient documentation

## 2019-03-14 DIAGNOSIS — Z79899 Other long term (current) drug therapy: Secondary | ICD-10-CM | POA: Insufficient documentation

## 2019-03-14 DIAGNOSIS — W01198A Fall on same level from slipping, tripping and stumbling with subsequent striking against other object, initial encounter: Secondary | ICD-10-CM | POA: Insufficient documentation

## 2019-03-14 DIAGNOSIS — S0181XA Laceration without foreign body of other part of head, initial encounter: Secondary | ICD-10-CM | POA: Insufficient documentation

## 2019-03-14 DIAGNOSIS — W19XXXA Unspecified fall, initial encounter: Secondary | ICD-10-CM

## 2019-03-14 MED ORDER — LIDOCAINE-EPINEPHRINE (PF) 2 %-1:200000 IJ SOLN
INTRAMUSCULAR | Status: AC
  Filled 2019-03-14: qty 20

## 2019-03-14 MED ORDER — LIDOCAINE-EPINEPHRINE (PF) 2 %-1:200000 IJ SOLN
10.0000 mL | Freq: Once | INTRAMUSCULAR | Status: AC
  Administered 2019-03-14: 10 mL

## 2019-03-14 NOTE — ED Triage Notes (Signed)
Lost footing on steps, hit upper forehead on brick step, no LOC or neck pain,rt leg pain from ? abrasion

## 2019-03-14 NOTE — ED Notes (Signed)
Suture cart bedside. 

## 2019-03-14 NOTE — ED Provider Notes (Signed)
[place] DEPT Provider Note   CSN: [id number] Arrival date & time: 03/14/19  1002     History Chief Complaint  Patient presents with  . Laceration    Maria Mack is a 39 y.o. female.  HPI 39 year old female presents today after fall.  She tripped and fell striking her head against steps.  There is no loss of consciousness.  She is complaining of head pain on her forehead and bleeding.  Had some pain to her right lower leg.  She feels this is just a scrape.  She has been ambulatory on that leg without difficulty.  She denies any other injuries.  She is not on a blood thinners.    Past Medical History:  Diagnosis Date  . Allergy    rhinitis  . Asthma   . Depression    teenage depression    Patient Active Problem List   Diagnosis Date Noted  . Depressive disorder, not elsewhere classified 07/30/2010  . GASTROENTERITIS 07/23/2010  . 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 Migraines 06/09/2010  . 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 TMJ PAIN 06/02/2010  . AMENORRHEA, SECONDARY 12/23/2009  . ALLERGIC RHINITIS 04/01/2009  . ASTHMA 04/01/2009    History reviewed. No pertinent surgical history.   OB History   No obstetric history on file.     Family History  Problem Relation Age of Onset  . Alcohol abuse Father   . Leukemia Mother        cml  . Hypertension Mother   . Hyperlipidemia Mother   . Diabetes Mother   . Asthma Sister   . Hypertension Sister   . Depression Other        family hx of    Social History   Tobacco Use  . Smoking status: Never Smoker  . Smokeless tobacco: Never Used  Substance Use Topics  . Alcohol use: No    Comment: rare  . Drug use: No    Home Medications Prior to Admission medications   Medication Sig Start Date End Date Taking? Authorizing Provider  cetirizine (ZYRTEC) 10 MG tablet Take 10 mg by mouth daily.    [provider]  diphenhydrAMINE (BENADRYL) 25 mg capsule Take 25 mg by  mouth every 6 (six) hours as needed for allergies.    [provider]  hydrOXYzine (ATARAX/VISTARIL) 25 MG tablet Take 1 tablet (25 mg total) by mouth every 6 (six) hours. 05/15/13   Noe Gens, PA-C  predniSONE (DELTASONE) 20 MG tablet Take 2 tablets (40 mg total) by mouth daily. 05/15/13   Noe Gens, PA-C    Allergies    Amoxicillin and Shellfish allergy  Review of Systems   Review of Systems  All other systems reviewed and are negative.   Physical Exam Updated Vital Signs BP (!) 140/104 (BP Location: Right Arm)   Pulse 95   Temp 98.4 F (36.9 C) (Oral)   Resp 17   Ht 1.6 m (5\' 3" )   Wt 136.1 kg   SpO2 99%   BMI 53.14 kg/m   Physical Exam Vitals and nursing note reviewed.  Constitutional:      Appearance: Normal appearance.  HENT:     Head:     Comments: 4 cm laceration forehead    Right Ear: External ear normal.     Left Ear: External ear normal.     Nose: Nose normal.     Mouth/Throat:     Mouth: Mucous membranes are moist.  Cardiovascular:     Rate and Rhythm: Normal rate and regular rhythm.     Pulses: Normal pulses.  Pulmonary:     Effort: Pulmonary effort is normal.  Abdominal:     General: Abdomen is flat.     Palpations: Abdomen is soft.  Musculoskeletal:        General: Normal range of motion.     Cervical back: Normal range of motion and neck supple.     Comments: No point tenderness over knee or right lower extremity.  Full active range of motion of all extremities  Skin:    Capillary Refill: Capillary refill takes less than 2 seconds.  Neurological:     General: No focal deficit present.     Mental Status: She is alert and oriented to person, place, and time.  Psychiatric:        Mood and Affect: Mood normal.     ED Results / Procedures / Treatments   Labs (all labs ordered are listed, but only abnormal results are displayed) Labs Reviewed - No data to display  EKG None  Radiology No results  found.  Procedures .Marland KitchenLaceration Repair  Date/Time: 03/14/2019 11:33 AM Performed by: Margarita Grizzle, MD Authorized by: Margarita Grizzle, MD   Consent:    Consent obtained:  Verbal   Consent given by:  Patient   Risks discussed:  Pain and infection Anesthesia (see MAR for exact dosages):    Anesthesia method:  Local infiltration   Local anesthetic:  Lidocaine 1% WITH epi Laceration details:    Location:  Face   Face location:  Forehead   Length (cm):  4   Depth (mm):  10 Repair type:    Repair type:  Simple Pre-procedure details:    Preparation:  Patient was prepped and draped in usual sterile fashion Exploration:    Hemostasis achieved with:  Direct pressure   Wound exploration: wound explored through full range of motion     Wound extent: no fascia violation noted     Contaminated: no   Treatment:    Area cleansed with:  Saline   Irrigation solution:  Sterile saline   Irrigation method:  Syringe Skin repair:    Repair method:  Sutures   Suture size:  5-0   Suture material:  Prolene   Suture technique:  Simple interrupted   Number of sutures:  5 Approximation:    Approximation:  Close Post-procedure details:    Dressing:  Sterile dressing   Patient tolerance of procedure:  Tolerated well, no immediate complications   (including critical care time)  Medications Ordered in ED Medications - No data to display  ED Course  I have reviewed the triage vital signs and the nursing notes.  Pertinent labs & imaging results that were available during my care of the patient were reviewed by me and considered in my medical decision making (see chart for details).    MDM Rules/Calculators/A&P                      39 year old female with trip and fall today who struck her head on bricks.  Head CT shows no evidence of acute intracranial abnormality.  Patient has normal neurological exam here.  She does not appear to have any other significant injuries.  Laceration repaired.   Discussed return precautions and need for follow-up for her suture removal.  Patient voices understanding. Final Clinical Impression(s) / ED Diagnoses Final diagnoses:  Facial laceration, initial encounter  Fall,  initial encounter    Rx / DC Orders ED Discharge Orders    None       Margarita Grizzle, MD 03/14/19 1136

## 2019-03-14 NOTE — Discharge Instructions (Signed)
Sutures out in 7 days Return if you have any signs of infection as discussed Return if you have worsening headache, confusion, weakness, or vomiting.

## 2021-08-23 IMAGING — CT CT HEAD W/O CM
3 of 4 series · 15 of 47 positions shown, 18 images · non-contrast
Comparison: None.

CLINICAL DATA: Status post fall today with a blow to the head.
Initial encounter.

EXAM:
CT HEAD WITHOUT CONTRAST
TECHNIQUE: Contiguous axial images were obtained from the base of the skull
through the vertex without intravenous contrast.

[Series 2: head wo · axial · 0.47mm/px · z∈[-145,-30]mm · 9 of 29 slices shown, 12 images]
[im 3/29  brain]
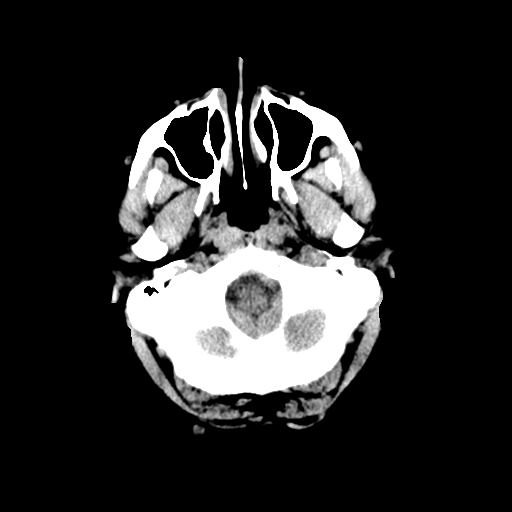
[im 3/29  bone]
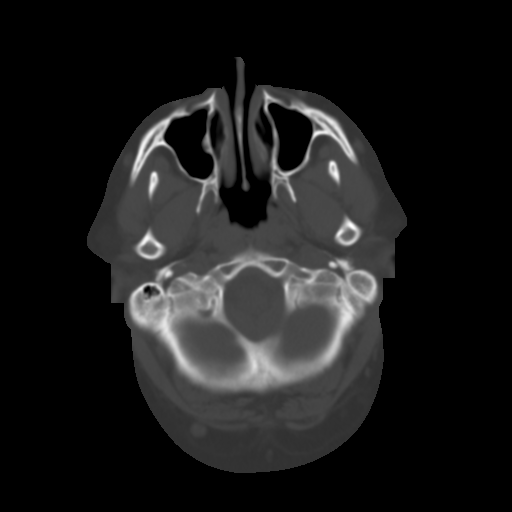
[im 6/29  brain]
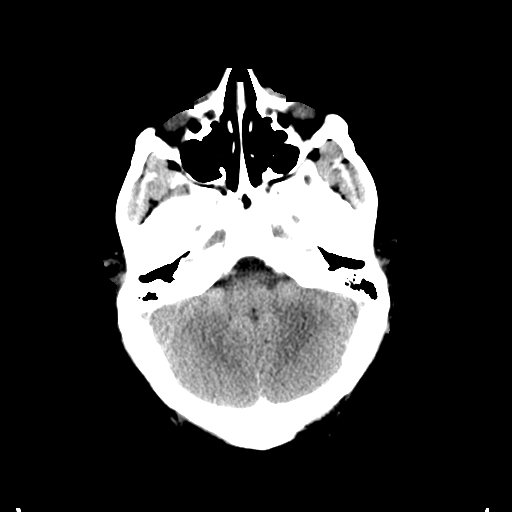
[im 9/29  brain]
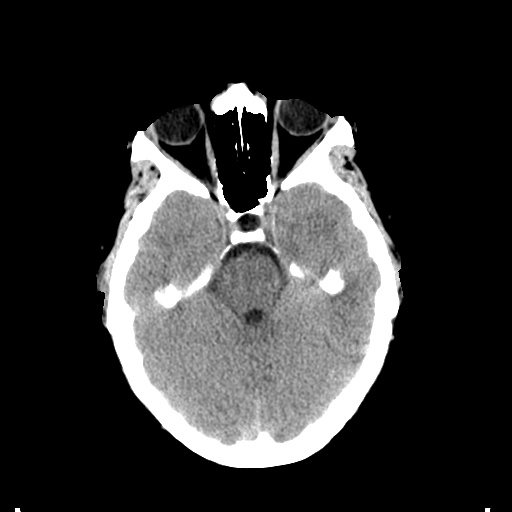
[im 12/29  brain]
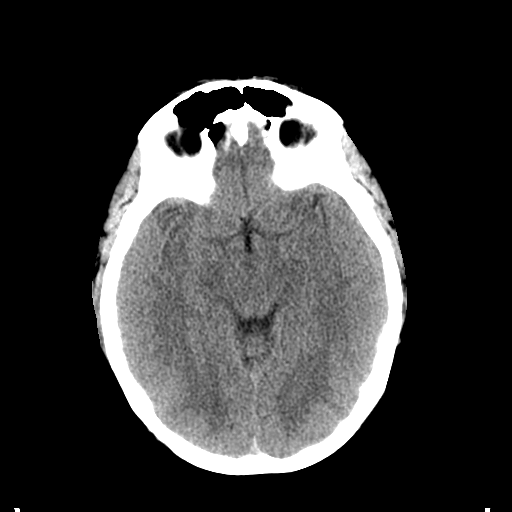
[im 15/29  brain]
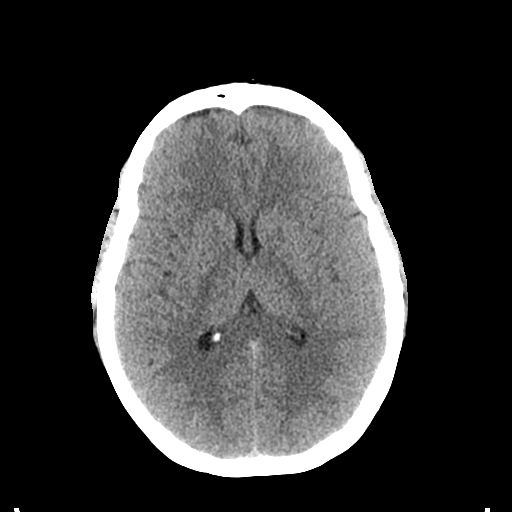
[im 15/29  bone]
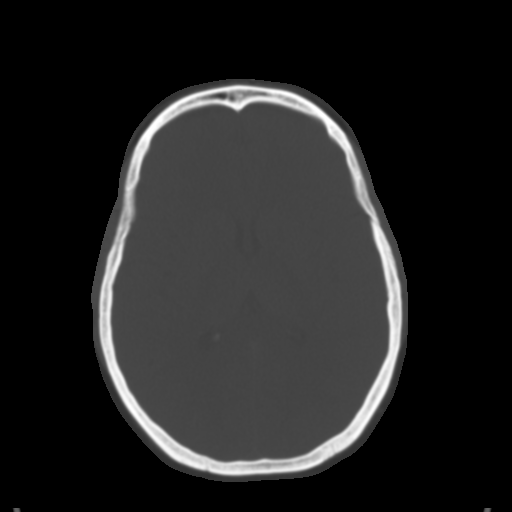
[im 17/29  brain]
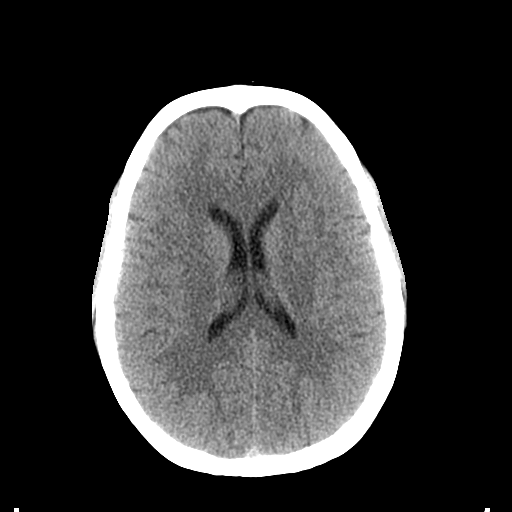
[im 20/29  brain]
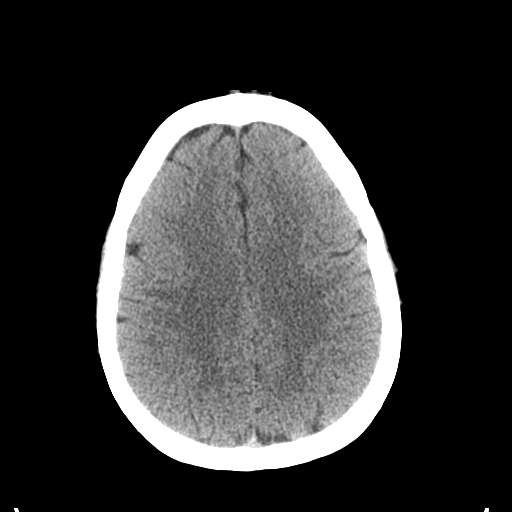
[im 23/29  brain]
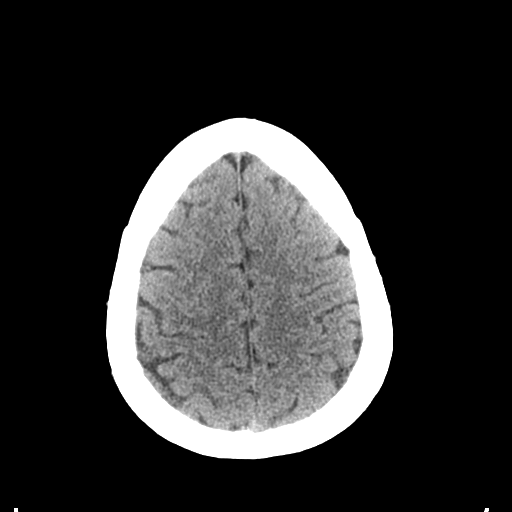
[im 26/29  brain]
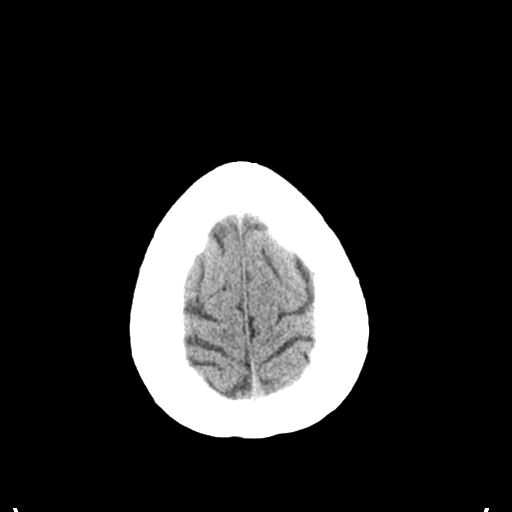
[im 26/29  bone]
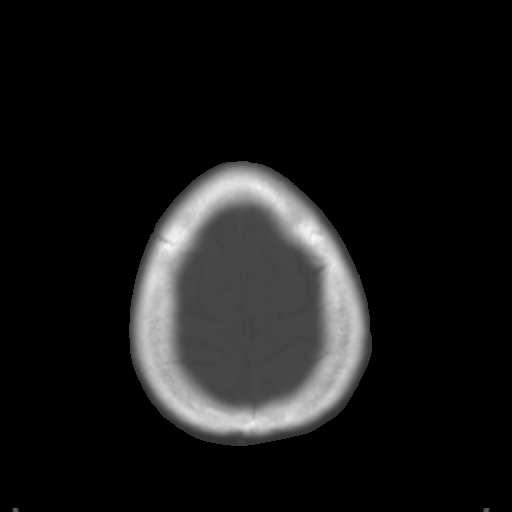

[Series 5: coronal soft tissue · coronal · 0.30mm/px · 3 of 71 slices shown]
[im 24/71  brain]
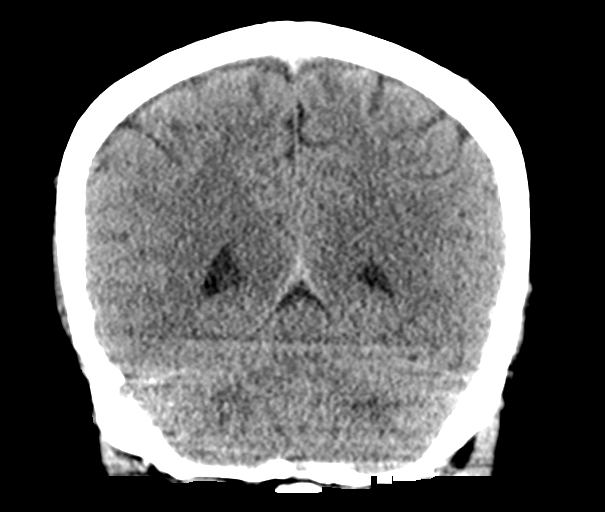
[im 32/71  brain]
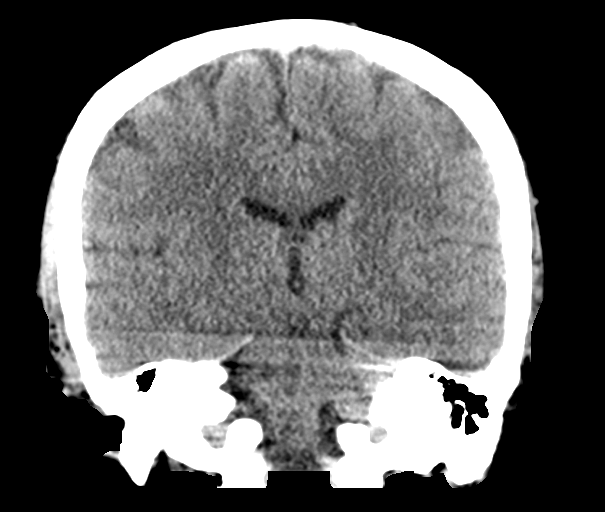
[im 39/71  brain]
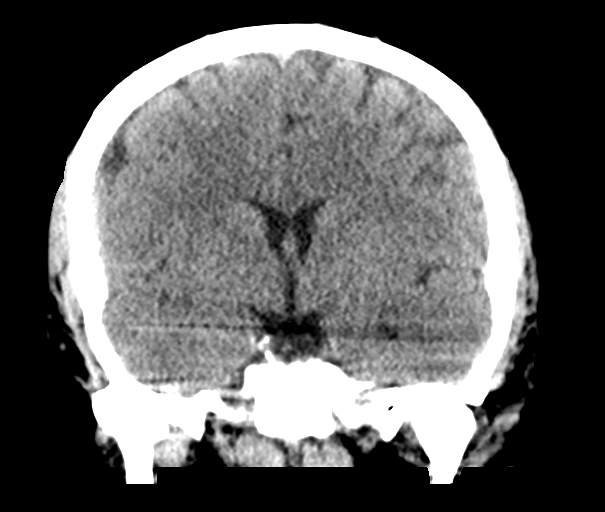

[Series 6: sagittal soft tissue · sagittal · 0.31mm/px · 3 of 57 slices shown]
[im 19/57  brain]
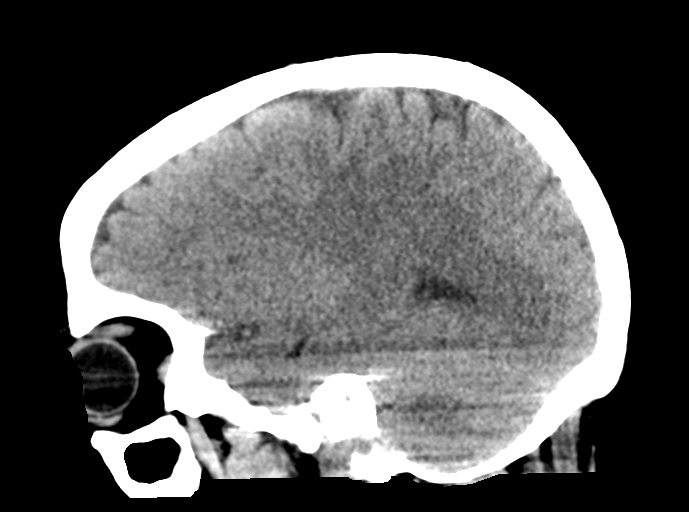
[im 29/57  brain]
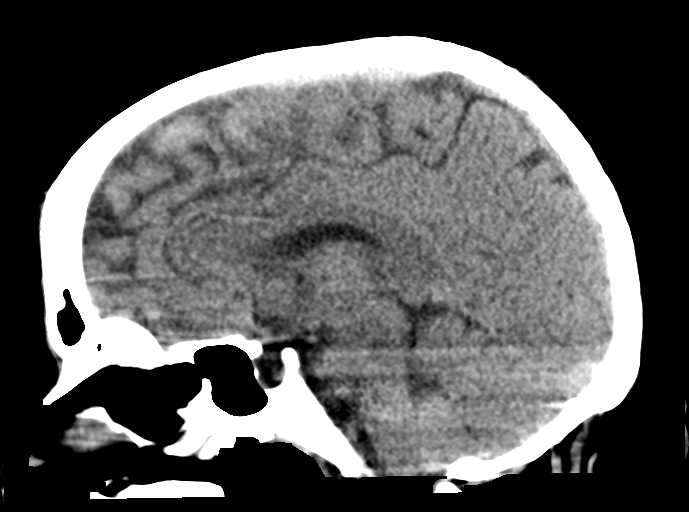
[im 38/57  brain]
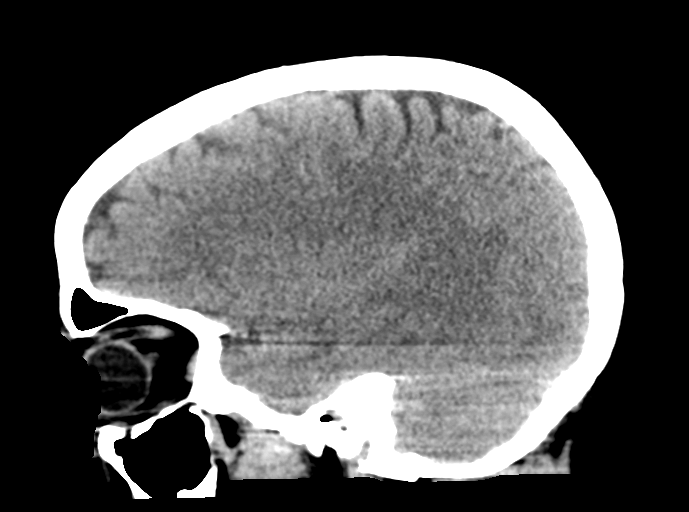

[15 of 47 positions shown; findings below may reference images not displayed]

FINDINGS: Brain: No evidence of acute infarction, hemorrhage, hydrocephalus,
extra-axial collection or mass lesion/mass effect.

Vascular: No hyperdense vessel or unexpected calcification.

Skull: Intact.  No focal lesion.

Sinuses/Orbits: Negative.

Other: Laceration of the central aspect of the forehead noted. No
underlying foreign body.
IMPRESSION: Laceration on the forehead without underlying foreign body or
fracture. No acute intracranial normality.
